# Patient Record
Sex: Female | Born: 1950 | Hispanic: Yes | Marital: Single | Smoking: Never smoker
Health system: Southern US, Community
[De-identification: ages and names within clinical notes are randomized; demographics above are authoritative.]

## PROBLEM LIST (undated history)

## (undated) DIAGNOSIS — E119 Type 2 diabetes mellitus without complications: Secondary | ICD-10-CM

## (undated) DIAGNOSIS — E785 Hyperlipidemia, unspecified: Secondary | ICD-10-CM

## (undated) DIAGNOSIS — F32A Depression, unspecified: Secondary | ICD-10-CM

## (undated) DIAGNOSIS — T8859XA Other complications of anesthesia, initial encounter: Secondary | ICD-10-CM

## (undated) DIAGNOSIS — T4145XA Adverse effect of unspecified anesthetic, initial encounter: Secondary | ICD-10-CM

## (undated) DIAGNOSIS — I10 Essential (primary) hypertension: Secondary | ICD-10-CM

## (undated) DIAGNOSIS — K219 Gastro-esophageal reflux disease without esophagitis: Secondary | ICD-10-CM

## (undated) HISTORY — PX: TUBAL LIGATION: SHX77

---
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
## 2004-05-17 ENCOUNTER — Emergency Department: Payer: Self-pay | Admitting: Emergency Medicine

## 2004-12-17 ENCOUNTER — Ambulatory Visit: Payer: Self-pay | Admitting: Unknown Physician Specialty

## 2005-01-22 ENCOUNTER — Ambulatory Visit: Payer: Self-pay | Admitting: Unknown Physician Specialty

## 2006-04-07 ENCOUNTER — Ambulatory Visit: Payer: Self-pay

## 2006-09-09 ENCOUNTER — Emergency Department: Payer: Self-pay | Admitting: General Practice

## 2006-09-09 ENCOUNTER — Other Ambulatory Visit: Payer: Self-pay

## 2007-08-31 ENCOUNTER — Ambulatory Visit: Payer: Self-pay

## 2009-09-06 ENCOUNTER — Emergency Department: Payer: Self-pay | Admitting: Emergency Medicine

## 2011-08-04 ENCOUNTER — Ambulatory Visit: Payer: Self-pay | Admitting: Family Medicine

## 2011-08-11 ENCOUNTER — Emergency Department: Payer: Self-pay | Admitting: Emergency Medicine

## 2011-08-11 LAB — URINALYSIS, COMPLETE
Blood: NEGATIVE
Hyaline Cast: 1
Nitrite: NEGATIVE
Protein: NEGATIVE
RBC,UR: 1 /HPF (ref 0–5)

## 2011-08-11 LAB — CBC
HGB: 14.5 g/dL (ref 12.0–16.0)
MCH: 29.6 pg (ref 26.0–34.0)
MCV: 88 fL (ref 80–100)
Platelet: 235 10*3/uL (ref 150–440)
WBC: 7.8 10*3/uL (ref 3.6–11.0)

## 2011-08-11 LAB — COMPREHENSIVE METABOLIC PANEL
Alkaline Phosphatase: 88 U/L (ref 50–136)
Anion Gap: 10 (ref 7–16)
BUN: 23 mg/dL — ABNORMAL HIGH (ref 7–18)
Bilirubin,Total: 0.5 mg/dL (ref 0.2–1.0)
Calcium, Total: 8.7 mg/dL (ref 8.5–10.1)
Chloride: 98 mmol/L (ref 98–107)
Creatinine: 0.71 mg/dL (ref 0.60–1.30)
EGFR (African American): >60
Glucose: 224 mg/dL — ABNORMAL HIGH (ref 65–99)
Sodium: 138 mmol/L (ref 136–145)
Total Protein: 7.7 g/dL (ref 6.4–8.2)

## 2011-08-20 ENCOUNTER — Ambulatory Visit: Payer: Self-pay | Admitting: Internal Medicine

## 2014-03-12 ENCOUNTER — Emergency Department: Payer: Self-pay | Admitting: Emergency Medicine

## 2014-08-28 ENCOUNTER — Emergency Department: Admit: 2014-08-28 | Disposition: A | Discharge status: Home / Self Care | Payer: Self-pay | Admitting: Student

## 2016-03-14 ENCOUNTER — Emergency Department
Admission: EM | Admit: 2016-03-14 | Discharge: 2016-03-14 | Disposition: A | Discharge status: Home / Self Care | Payer: Self-pay | Attending: Emergency Medicine | Admitting: Emergency Medicine

## 2016-03-14 ENCOUNTER — Encounter: Payer: Self-pay | Admitting: Emergency Medicine

## 2016-03-14 DIAGNOSIS — E1165 Type 2 diabetes mellitus with hyperglycemia: Secondary | ICD-10-CM | POA: Insufficient documentation

## 2016-03-14 DIAGNOSIS — K5900 Constipation, unspecified: Secondary | ICD-10-CM | POA: Insufficient documentation

## 2016-03-14 DIAGNOSIS — R739 Hyperglycemia, unspecified: Secondary | ICD-10-CM

## 2016-03-14 HISTORY — DX: Hyperlipidemia, unspecified: E78.5

## 2016-03-14 HISTORY — DX: Gastro-esophageal reflux disease without esophagitis: K21.9

## 2016-03-14 HISTORY — DX: Type 2 diabetes mellitus without complications: E11.9

## 2016-03-14 LAB — CBC
HCT: 40.3 % (ref 35.0–47.0)
Hemoglobin: 13.3 g/dL (ref 12.0–16.0)
MCH: 27.9 pg (ref 26.0–34.0)
MCHC: 33 g/dL (ref 32.0–36.0)
MCV: 84.6 fL (ref 80.0–100.0)
PLATELETS: 241 10*3/uL (ref 150–440)
RBC: 4.76 MIL/uL (ref 3.80–5.20)
RDW: 14.8 % — AB (ref 11.5–14.5)
WBC: 4.9 10*3/uL (ref 3.6–11.0)

## 2016-03-14 LAB — COMPREHENSIVE METABOLIC PANEL
ALK PHOS: 90 U/L (ref 38–126)
ALT: 18 U/L (ref 14–54)
AST: 22 U/L (ref 15–41)
Albumin: 3.7 g/dL (ref 3.5–5.0)
Anion gap: 8 (ref 5–15)
BUN: 17 mg/dL (ref 6–20)
CALCIUM: 8.7 mg/dL — AB (ref 8.9–10.3)
CHLORIDE: 97 mmol/L — AB (ref 101–111)
CO2: 30 mmol/L (ref 22–32)
CREATININE: 0.76 mg/dL (ref 0.44–1.00)
GFR calc Af Amer: >60 mL/min (ref >60)
Glucose, Bld: 452 mg/dL — ABNORMAL HIGH (ref 65–99)
Potassium: 4.2 mmol/L (ref 3.5–5.1)
Sodium: 135 mmol/L (ref 135–145)
Total Bilirubin: 0.3 mg/dL (ref 0.3–1.2)
Total Protein: 7.9 g/dL (ref 6.5–8.1)

## 2016-03-14 LAB — GLUCOSE, CAPILLARY: Glucose-Capillary: 456 mg/dL — ABNORMAL HIGH (ref 65–99)

## 2016-03-14 LAB — LIPASE, BLOOD: LIPASE: 24 U/L (ref 11–51)

## 2016-03-14 MED ORDER — METFORMIN HCL 500 MG PO TABS
500.0000 mg | ORAL_TABLET | Freq: Once | ORAL | Status: AC
  Administered 2016-03-14: 500 mg via ORAL
  Filled 2016-03-14: qty 1

## 2016-03-14 MED ORDER — LACTULOSE 10 GM/15ML PO SOLN
10.0000 g | Freq: Once | ORAL | Status: AC
  Administered 2016-03-14: 10 g via ORAL
  Filled 2016-03-14: qty 30

## 2016-03-14 MED ORDER — DOCUSATE SODIUM 100 MG PO CAPS: 100.0000 mg | ORAL_CAPSULE | Freq: Every day | ORAL | 0 refills | Status: AC | PRN

## 2016-03-14 MED ORDER — DOCUSATE SODIUM 100 MG PO CAPS
100.0000 mg | ORAL_CAPSULE | Freq: Once | ORAL | Status: AC
  Administered 2016-03-14: 100 mg via ORAL
  Filled 2016-03-14: qty 1

## 2016-03-14 MED ORDER — SODIUM CHLORIDE 0.9 % IV BOLUS (SEPSIS)
1000.0000 mL | Freq: Once | INTRAVENOUS | Status: AC
  Administered 2016-03-14: 1000 mL via INTRAVENOUS

## 2016-03-14 MED ORDER — MAGNESIUM CITRATE PO SOLN
0.5000 | Freq: Once | ORAL | Status: AC
  Administered 2016-03-14: 0.5 via ORAL
  Filled 2016-03-14: qty 296

## 2016-03-14 NOTE — ED Provider Notes (Signed)
[facility] Emergency Department Provider Note  ____________________________________________   First MD Initiated Contact with Patient 03/14/16 1611     (approximate)  I have reviewed the triage vital signs and the nursing notes.   HISTORY  Chief Complaint Abdominal Pain and Hyperglycemia Patient is Spanish-speaking. Rafael, the Spanish interpreter, was present for the interaction.  HPI Tara Salinas is a 65 y.o. female with a history of diabetes as well as hyperlipidemia who is presenting to the emergency department today with lower abdominal cramping over the past 4 days as well as constipation. She says that she has been pushing to move her bowels and feels like there is something stuck.  Denies any nausea or vomiting. Says that there have been times when she would go about 3 days without moving her bowels with this is longer.  Pain is only mild into the lower abdomen. Says is having difficulty urinating because of what she thinks is the pressure from her not being able to move her bowels.  Also says that she is not taking her diabetes medications but will be able to get up this Monday. She says that she had run out of her prescription.  Past Medical History:  Diagnosis Date  . Diabetes mellitus without complication (HCC)   . GERD (gastroesophageal reflux disease)   . Hyperlipemia     There are no active problems to display for this patient.   Past Surgical History:  Procedure Laterality Date  . TUBAL LIGATION      Prior to Admission medications   Not on File    Allergies Review of patient's allergies indicates no known allergies.  No family history on file.  Social History Social History  Substance Use Topics  . Smoking status: Never Smoker  . Smokeless tobacco: Not on file  . Alcohol use No    Review of Systems Constitutional: No fever/chills Eyes: No visual changes. ENT: No sore throat. Cardiovascular: Denies chest  pain. Respiratory: Denies shortness of breath. Gastrointestinal: No nausea, no vomiting.  No diarrhea.   Genitourinary: Negative for dysuria. Musculoskeletal: Negative for back pain. Skin: Negative for rash. Neurological: Negative for headaches, focal weakness or numbness.  10-point ROS otherwise negative.  ____________________________________________   PHYSICAL EXAM:  VITAL SIGNS: ED Triage Vitals  Enc Vitals Group     BP 03/14/16 1530 (!) 148/60     Pulse Rate 03/14/16 1530 91     Resp 03/14/16 1530 16     Temp 03/14/16 1530 97.7 F (36.5 C)     Temp Source 03/14/16 1530 Oral     SpO2 03/14/16 1530 97 %     Weight 03/14/16 1536 204 lb (92.5 kg)     Height 03/14/16 1536 5\' 4"  (1.626 m)     Head Circumference --      Peak Flow --      Pain Score 03/14/16 1537 10     Pain Loc --      Pain Edu? --      Excl. in GC? --     Constitutional: Alert and oriented. Well appearing and in no acute distress. Eyes: Conjunctivae are normal. PERRL. EOMI. Head: Atraumatic. Nose: No congestion/rhinnorhea. Mouth/Throat: Mucous membranes are moist.   Neck: No stridor.   Cardiovascular: Normal rate, regular rhythm. Grossly normal heart sounds.   Respiratory: Normal respiratory effort.  No retractions. Lungs CTAB. Gastrointestinal: Soft and nontender. No distention. Digital rectal exam without any fecal impaction. Grossly brown stool. Musculoskeletal: No lower extremity  tenderness nor edema.  No joint effusions. Neurologic:  Normal speech and language. No gross focal neurologic deficits are appreciated.  Skin:  Skin is warm, dry and intact. No rash noted. Psychiatric: Mood and affect are normal. Speech and behavior are normal.  ____________________________________________   LABS (all labs ordered are listed, but only abnormal results are displayed)  Labs Reviewed  COMPREHENSIVE METABOLIC PANEL - Abnormal; Notable for the following:       Result Value   Chloride 97 (*)    Glucose,  Bld 452 (*)    Calcium 8.7 (*)    All other components within normal limits  CBC - Abnormal; Notable for the following:    RDW 14.8 (*)    All other components within normal limits  GLUCOSE, CAPILLARY - Abnormal; Notable for the following:    Glucose-Capillary 456 (*)    All other components within normal limits  LIPASE, BLOOD   ____________________________________________  EKG   ____________________________________________  RADIOLOGY   ____________________________________________   PROCEDURES  Procedure(s) performed:   Procedures  Critical Care performed:   ____________________________________________   INITIAL IMPRESSION / ASSESSMENT AND PLAN / ED COURSE  Pertinent labs & imaging results that were available during my care of the patient were reviewed by me and considered in my medical decision making (see chart for details).   ----------------------------------------- 8:28 PM on 03/14/2016 -----------------------------------------  Patient had an enema and then had a bowel movement and now saying that she feels much better and would like to go home. I reexamined her abdomen is soft and nontender. She'll be discharged to home with a prescription for Colace. We will also give her dose of metformin for today as well as tomorrow. She says that she'll be picking up her regular prescriptions coming Monday. No signs of DKA.  Clinical Course     ____________________________________________   FINAL CLINICAL IMPRESSION(S) / ED DIAGNOSES  Hyperglycemia. Constipation.    NEW MEDICATIONS STARTED DURING THIS VISIT:  New Prescriptions   No medications on file     Note:  This document was prepared using Dragon voice recognition software and may include unintentional dictation errors.    Myrna Blazeravid Matthew Esa Raden, MD 03/14/16 2031

## 2016-03-14 NOTE — ED Triage Notes (Signed)
Pt reports lower abd pressure worse when her bladder is full since Thursday. C/o frequency and trouble urinating.  Denies fever Pt also c/o constipation. Last BM, last week has tried miralax with no relief.

## 2016-03-14 NOTE — ED Notes (Signed)
CBG 456 in triage

## 2016-03-14 NOTE — ED Notes (Signed)
Pt reports blood sugars have been elevated but out of her blood sugar medicine. Reports called in refill but cannot get until Monday because pharmacy closed.

## 2016-05-08 ENCOUNTER — Other Ambulatory Visit: Payer: Self-pay | Admitting: Family Medicine

## 2016-05-08 DIAGNOSIS — Z0000 Encounter for general adult medical examination without abnormal findings: Secondary | ICD-10-CM

## 2016-07-16 ENCOUNTER — Ambulatory Visit: Payer: Self-pay | Attending: Family Medicine

## 2016-09-12 ENCOUNTER — Emergency Department
Admission: EM | Admit: 2016-09-12 | Discharge: 2016-09-12 | Disposition: A | Discharge status: Home / Self Care | Payer: Self-pay | Attending: Emergency Medicine | Admitting: Emergency Medicine

## 2016-09-12 ENCOUNTER — Encounter: Payer: Self-pay | Admitting: Emergency Medicine

## 2016-09-12 ENCOUNTER — Emergency Department: Payer: Self-pay

## 2016-09-12 DIAGNOSIS — Z7984 Long term (current) use of oral hypoglycemic drugs: Secondary | ICD-10-CM | POA: Insufficient documentation

## 2016-09-12 DIAGNOSIS — K1120 Sialoadenitis, unspecified: Secondary | ICD-10-CM | POA: Insufficient documentation

## 2016-09-12 DIAGNOSIS — E119 Type 2 diabetes mellitus without complications: Secondary | ICD-10-CM | POA: Insufficient documentation

## 2016-09-12 DIAGNOSIS — R220 Localized swelling, mass and lump, head: Secondary | ICD-10-CM

## 2016-09-12 LAB — CBC WITH DIFFERENTIAL/PLATELET
BASOS ABS: 0.1 10*3/uL (ref 0–0.1)
BASOS PCT: 1 %
EOS ABS: 0 10*3/uL (ref 0–0.7)
EOS PCT: 0 %
HCT: 40.3 % (ref 35.0–47.0)
Hemoglobin: 13.5 g/dL (ref 12.0–16.0)
Lymphocytes Relative: 13 %
Lymphs Abs: 1.3 10*3/uL (ref 1.0–3.6)
MCH: 28.1 pg (ref 26.0–34.0)
MCHC: 33.5 g/dL (ref 32.0–36.0)
MCV: 84 fL (ref 80.0–100.0)
MONO ABS: 0.8 10*3/uL (ref 0.2–0.9)
Monocytes Relative: 8 %
Neutro Abs: 7.9 10*3/uL — ABNORMAL HIGH (ref 1.4–6.5)
Neutrophils Relative %: 78 %
Platelets: 296 10*3/uL (ref 150–440)
RBC: 4.8 MIL/uL (ref 3.80–5.20)
RDW: 14.5 % (ref 11.5–14.5)
WBC: 10.1 10*3/uL (ref 3.6–11.0)

## 2016-09-12 LAB — BASIC METABOLIC PANEL
ANION GAP: 11 (ref 5–15)
BUN: 13 mg/dL (ref 6–20)
CALCIUM: 9.2 mg/dL (ref 8.9–10.3)
CO2: 31 mmol/L (ref 22–32)
CREATININE: 0.44 mg/dL (ref 0.44–1.00)
Chloride: 92 mmol/L — ABNORMAL LOW (ref 101–111)
GFR calc Af Amer: >60 mL/min (ref >60)
GLUCOSE: 160 mg/dL — AB (ref 65–99)
Potassium: 3.7 mmol/L (ref 3.5–5.1)
Sodium: 134 mmol/L — ABNORMAL LOW (ref 135–145)

## 2016-09-12 MED ORDER — SODIUM CHLORIDE 0.9 % IV BOLUS (SEPSIS)
1000.0000 mL | Freq: Once | INTRAVENOUS | Status: AC
  Administered 2016-09-12: 1000 mL via INTRAVENOUS

## 2016-09-12 MED ORDER — METHYLPREDNISOLONE 8 MG PO TABS: ORAL_TABLET | ORAL | 0 refills | Status: DC

## 2016-09-12 MED ORDER — KETOROLAC TROMETHAMINE 30 MG/ML IJ SOLN
30.0000 mg | Freq: Once | INTRAMUSCULAR | Status: AC
  Administered 2016-09-12: 30 mg via INTRAVENOUS
  Filled 2016-09-12: qty 1

## 2016-09-12 MED ORDER — AMOXICILLIN-POT CLAVULANATE 875-125 MG PO TABS: 1.0000 | ORAL_TABLET | Freq: Two times a day (BID) | ORAL | 0 refills | Status: AC

## 2016-09-12 MED ORDER — IOPAMIDOL (ISOVUE-300) INJECTION 61%
75.0000 mL | Freq: Once | INTRAVENOUS | Status: AC | PRN
  Administered 2016-09-12: 75 mL via INTRAVENOUS

## 2016-09-12 NOTE — Discharge Instructions (Signed)
Please seek medical attention for any high fevers, chest pain, shortness of breath, difficulty with breathing, change in behavior, persistent vomiting, bloody stool or any other new or concerning symptoms.

## 2016-09-12 NOTE — ED Provider Notes (Signed)
[facility] Emergency Department Provider Note ____________________________________________   I have reviewed the triage vital signs and the triage nursing note.  HISTORY  Chief Complaint Facial Swelling   Historian Patient - through Spanish interpreter  HPI Tara Salinas is a 66 y.o. female with a history of diabetes, presents with swelling under the left jaw that started yesterday. It is worse today. Moderate to severe swelling and pain. No skin rash or redness. States it hurts now feels a little swollen on the inside of her cheek. Mild increased trouble swallowing. No trouble breathing. She is missing teeth, but there are no additional broken teeth at the lower back jaw.  Denies fevers. Denies nausea or vomiting.  Nothing seems to worsen better. No similar symptoms previously.    Past Medical History:  Diagnosis Date  . Diabetes mellitus without complication (HCC)   . GERD (gastroesophageal reflux disease)   . Hyperlipemia     There are no active problems to display for this patient.   Past Surgical History:  Procedure Laterality Date  . TUBAL LIGATION      Prior to Admission medications   Medication Sig Start Date End Date Taking? Authorizing Provider  atorvastatin (LIPITOR) 40 MG tablet Take 40 mg by mouth daily.   Yes [provider]  hydrOXYzine (ATARAX/VISTARIL) 10 MG tablet Take 10 mg by mouth at bedtime as needed (to help patient fall asleep).   Yes [provider]  metFORMIN (GLUCOPHAGE) 1000 MG tablet Take 1,000 mg by mouth 2 (two) times daily with a meal.   Yes [provider]  pantoprazole (PROTONIX) 40 MG tablet Take 40 mg by mouth daily.   Yes [provider]  sertraline (ZOLOFT) 50 MG tablet Take 50 mg by mouth daily.   Yes [provider]  docusate sodium (COLACE) 100 MG capsule Take 1 capsule (100 mg total) by mouth daily as needed. 03/14/16 03/14/17  Schaevitz, Myra Rude, MD     No Known Allergies  No family history on file.  Social History Social History  Substance Use Topics  . Smoking status: Never Smoker  . Smokeless tobacco: Never Used  . Alcohol use No    Review of Systems  Constitutional: Negative for fever. Eyes: Negative for visual changes. ENT: Negative for sore throat.  Left jaw swelling as per history of present illness. Cardiovascular: Negative for chest pain. Respiratory: Negative for shortness of breath. Gastrointestinal: Negative for abdominal pain, vomiting and diarrhea. Genitourinary: Negative for dysuria. Musculoskeletal: Negative for back pain. Skin: Negative for rash. Neurological: Negative for headache. 10 point Review of Systems otherwise negative ____________________________________________   PHYSICAL EXAM:  VITAL SIGNS: ED Triage Vitals  Enc Vitals Group     BP 09/12/16 1147 (!) 148/67     Pulse Rate 09/12/16 1147 (!) 109     Resp 09/12/16 1147 18     Temp 09/12/16 1147 99.4 F (37.4 C)     Temp Source 09/12/16 1147 Oral     SpO2 09/12/16 1147 98 %     Weight 09/12/16 1148 200 lb (90.7 kg)     Height 09/12/16 1148 5\' 4"  (1.626 m)     Head Circumference --      Peak Flow --      Pain Score 09/12/16 1157 10     Pain Loc --      Pain Edu? --      Excl. in GC? --      Constitutional: Alert and oriented. Well  appearing and in no distress. HEENT   Head: Normocephalic.      Eyes: Conjunctivae are normal. PERRL. Normal extraocular movements.      Ears:         Nose: No congestion/rhinnorhea.   Mouth/Throat: Mucous membranes are moist.  Mild swelling at what appears to be exit site for salivary gland left lateral cheek. Moderate to severe swelling under the left jaw which is tender and firm. There is no redness. There is no pointing or obvious abscess.   Neck: No stridor. Cardiovascular/Chest: Normal rate, regular rhythm.  No murmurs, rubs, or gallops. Respiratory: Normal respiratory effort without  tachypnea nor retractions. Breath sounds are clear and equal bilaterally. No wheezes/rales/rhonchi. Gastrointestinal: Soft. No distention, no guarding, no rebound. Nontender.  Genitourinary/rectal:Deferred Musculoskeletal: Normal appearance of the extremities. Neurologic: No facial droop. Normal speech and language. No gross or focal neurologic deficits are appreciated. Skin:  Skin is warm, dry and intact. No rash noted. Psychiatric: Mood and affect are normal. Speech and behavior are normal. Patient exhibits appropriate insight and judgment.   ____________________________________________  LABS (pertinent positives/negatives)  Labs Reviewed  BASIC METABOLIC PANEL - Abnormal; Notable for the following:       Result Value   Sodium 134 (*)    Chloride 92 (*)    Glucose, Bld 160 (*)    All other components within normal limits  CBC WITH DIFFERENTIAL/PLATELET - Abnormal; Notable for the following:    Neutro Abs 7.9 (*)    All other components within normal limits    ____________________________________________    EKG I, Governor Rooksebecca Shaylen Nephew, MD, the attending physician have personally viewed and interpreted all ECGs.  None ____________________________________________  RADIOLOGY All Xrays were viewed by me. Imaging interpreted by Radiologist.  CT neck with contrast soft tissue:  Pending __________________________________________  PROCEDURES  Procedure(s) performed: None  Critical Care performed: None  ____________________________________________   ED COURSE / ASSESSMENT AND PLAN  Pertinent labs & imaging results that were available during my care of the patient were reviewed by me and considered in my medical decision making (see chart for details).   Left lower jaw swelling, suspect either infection versus swelling related to obstructed salivary gland.  Overall well-appearing. She does have a low-grade temperature and on triage pulse 109. No hypertension. She does not  report a fever at home.  After labs will CT the neck for further evaluation. Starting with IV fluids as well as Toradol for pain.  Patient care to be transferred to Dr. Derrill KayGoodman at shift change 4 PM. Disposition pending CT of the neck.    CONSULTATIONS:   None   Patient / Family / Caregiver informed of clinical course, medical decision-making process, and agree with plan.    ___________________________________________   FINAL CLINICAL IMPRESSION(S) / ED DIAGNOSES   Final diagnoses:  Jaw swelling              Note: This dictation was prepared with Dragon dictation. Any transcriptional errors that result from this process are unintentional    Governor RooksLord, Forrestine Lecrone, MD 09/12/16 1539

## 2016-09-12 NOTE — ED Notes (Signed)

## 2016-09-12 NOTE — ED Provider Notes (Signed)
-----------------------------------------   5:15 PM on 09/12/2016 -----------------------------------------  CT soft tissue neck IMPRESSION: 1. Left submandibular sialadenitis due to 7 x 4 mm proximal duct calculus. 2. Reactive edema in the supraglottic larynx thickens the epiglottis and left aryepiglottic fold and causes a degree of airway narrowing. 3. 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  Patient's CT returned showing a 7 x 4 mm stone in submandibular ducts. Discussed with Dr. Jenne CampusMcQueen who recommended and Robaxin steroids. The CT scan also is calling a degree of airway narrowing however patient is in no respiratory distress. Additionally if there is some swelling around her airway the steroids should help. Discussed return precautions with patient including airway issues. This discussion was done with a hospital interpreter   Phineas SemenGoodman, Ramya Vanbergen, MD 09/12/16 1729

## 2016-09-12 NOTE — ED Triage Notes (Addendum)
Through spanish interpreter:  Patient presents to the ED with swollen area to patient's left jaw.  Patient states area is tender on palpation and she first noticed area yesterday.  Patient is in no obvious distress at this time.  Patient is maintaining secretions but having slight difficulty speaking.  Patient reports difficulty swallowing, and states she has not been able to eat since yesterday.  Patient denies any dental issues or dental pain.  Patient reports some difficulty swallowing.

## 2018-01-11 ENCOUNTER — Other Ambulatory Visit: Payer: Self-pay | Admitting: Family Medicine

## 2018-01-11 DIAGNOSIS — Z1231 Encounter for screening mammogram for malignant neoplasm of breast: Secondary | ICD-10-CM

## 2018-02-13 ENCOUNTER — Emergency Department
Admission: EM | Admit: 2018-02-13 | Discharge: 2018-02-13 | Disposition: A | Discharge status: Home / Self Care | Payer: Self-pay | Attending: Emergency Medicine | Admitting: Emergency Medicine

## 2018-02-13 ENCOUNTER — Emergency Department: Payer: Self-pay

## 2018-02-13 ENCOUNTER — Other Ambulatory Visit: Payer: Self-pay

## 2018-02-13 DIAGNOSIS — Z79899 Other long term (current) drug therapy: Secondary | ICD-10-CM | POA: Insufficient documentation

## 2018-02-13 DIAGNOSIS — E119 Type 2 diabetes mellitus without complications: Secondary | ICD-10-CM | POA: Insufficient documentation

## 2018-02-13 DIAGNOSIS — S42251A Displaced fracture of greater tuberosity of right humerus, initial encounter for closed fracture: Secondary | ICD-10-CM | POA: Insufficient documentation

## 2018-02-13 DIAGNOSIS — W0110XA Fall on same level from slipping, tripping and stumbling with subsequent striking against unspecified object, initial encounter: Secondary | ICD-10-CM | POA: Insufficient documentation

## 2018-02-13 DIAGNOSIS — Z7984 Long term (current) use of oral hypoglycemic drugs: Secondary | ICD-10-CM | POA: Insufficient documentation

## 2018-02-13 DIAGNOSIS — Y929 Unspecified place or not applicable: Secondary | ICD-10-CM | POA: Insufficient documentation

## 2018-02-13 DIAGNOSIS — Y998 Other external cause status: Secondary | ICD-10-CM | POA: Insufficient documentation

## 2018-02-13 DIAGNOSIS — Y9389 Activity, other specified: Secondary | ICD-10-CM | POA: Insufficient documentation

## 2018-02-13 MED ORDER — HYDROCODONE-ACETAMINOPHEN 5-325 MG PO TABS
1.0000 | ORAL_TABLET | ORAL | Status: AC
  Administered 2018-02-13: 1 via ORAL
  Filled 2018-02-13: qty 1

## 2018-02-13 MED ORDER — HYDROCODONE-ACETAMINOPHEN 5-325 MG PO TABS: 1.0000 | ORAL_TABLET | ORAL | 0 refills | Status: DC | PRN

## 2018-02-13 NOTE — ED Triage Notes (Signed)
Pt c/o tripping and falling today. Right shoulder pain. Denies LOC.

## 2018-02-13 NOTE — ED Notes (Signed)
r shoulder immobilizer applied

## 2018-02-13 NOTE — ED Notes (Signed)
Pt reports yesterday-  She fell -  Stepped on an object and fell on carpet   PT inj r  Shoulder  rom limited  She did not hit her head

## 2018-02-13 NOTE — Discharge Instructions (Addendum)
Please call orthopedic office Monday morning to schedule follow-up appointment.  Continue with shoulder immobilizer.  You may remove mobilize to shower.  Avoid shoulder range of motion when out of the immobilizer.  Take Norco as needed for pain.  Return to the emergency department for any increasing pain, swelling, worsening symptoms or urgent changes in health.

## 2018-02-13 NOTE — ED Provider Notes (Signed)
[facility] EMERGENCY DEPARTMENT Provider Note   CSN: [id number] Arrival date & time: 02/13/18  1201     History   Chief Complaint Chief Complaint  Patient presents with  . Fall    HPI Tara Salinas is a 67 y.o. female presents to the emergency department for evaluation of right shoulder pain.  Patient stumbled and fell 1 day ago landing on her right shoulder.  She denies any other injury throughout her body.  No headache, neck pain.  No numbness tingling radicular symptoms.  Patient points her pain along the proximal humerus.  She denies any elbow or wrist pain.  She has not any medications for pain.  Pain is moderate.  No chest pain or shortness of breath.  HPI  Past Medical History:  Diagnosis Date  . Diabetes mellitus without complication (HCC)   . GERD (gastroesophageal reflux disease)   . Hyperlipemia     There are no active problems to display for this patient.   Past Surgical History:  Procedure Laterality Date  . TUBAL LIGATION       OB History   None      Home Medications    Prior to Admission medications   Medication Sig Start Date End Date Taking? Authorizing Provider  atorvastatin (LIPITOR) 40 MG tablet Take 40 mg by mouth daily.    [provider]  HYDROcodone-acetaminophen (NORCO) 5-325 MG tablet Take 1 tablet by mouth every 4 (four) hours as needed for moderate pain. 02/13/18   Evon Slack, PA-C  hydrOXYzine (ATARAX/VISTARIL) 10 MG tablet Take 10 mg by mouth at bedtime as needed (to help patient fall asleep).    [provider]  metFORMIN (GLUCOPHAGE) 1000 MG tablet Take 1,000 mg by mouth 2 (two) times daily with a meal.    [provider]  methylPREDNISolone (MEDROL) 8 MG tablet Take 6 tablets orally on Day 1 Take 5 tablets orally on Day 2 Take 4 tablets orally on Day 3 Take 3 tablets orally on Day 4 Take 2 tablets orally on Day 5 Take 1 tablets orally on Day 6 09/12/16   Phineas Semen, MD  pantoprazole (PROTONIX) 40 MG tablet Take 40 mg by mouth daily.    [provider]  sertraline (ZOLOFT) 50 MG tablet Take 50 mg by mouth daily.    [provider]    Family History No family history on file.  Social History Social History   Tobacco Use  . Smoking status: Never Smoker  . Smokeless tobacco: Never Used  Substance Use Topics  . Alcohol use: No  . Drug use: No     Allergies   Patient has no known allergies.   Review of Systems Review of Systems  Constitutional: Negative for activity change.  Eyes: Negative for pain and visual disturbance.  Respiratory: Negative for shortness of breath.   Cardiovascular: Negative for chest pain and leg swelling.  Gastrointestinal: Negative for abdominal pain.  Genitourinary: Negative for flank pain and pelvic pain.  Musculoskeletal: Positive for arthralgias. Negative for gait problem, joint swelling, myalgias, neck pain and neck stiffness.  Skin: Negative for wound.  Neurological: Negative for dizziness, syncope, weakness, light-headedness, numbness and headaches.  Psychiatric/Behavioral: Negative for confusion and decreased concentration.     Physical Exam Updated Vital Signs BP (!) 139/54   Pulse 90   Temp 98 F (36.7 C)   Resp 18   Ht 5' (1.524 m)   Wt 81.6 kg   SpO2 97%  BMI 35.15 kg/m   Physical Exam  Constitutional: She is oriented to person, place, and time. She appears well-developed and well-nourished.  HENT:  Head: Normocephalic and atraumatic.  Right Ear: External ear normal.  Left Ear: External ear normal.  Nose: Nose normal.  Eyes: Pupils are equal, round, and reactive to light. Conjunctivae and EOM are normal.  Neck: Normal range of motion.  Cardiovascular: Normal rate.  Pulmonary/Chest: Effort normal and breath sounds normal. No respiratory distress.  Abdominal: Soft. There is no tenderness.  Musculoskeletal: She exhibits no edema or deformity.  Positive  tenderness on the right proximal humerus with no deformity.  Neurovascular intact in right upper extremity.  Limited active range of motion of the right shoulder.  Neurological: She is alert and oriented to person, place, and time. No cranial nerve deficit. Coordination normal.  Skin: Skin is warm and dry. No rash noted.  Psychiatric: She has a normal mood and affect. Her behavior is normal.     ED Treatments / Results  Labs (all labs ordered are listed, but only abnormal results are displayed) Labs Reviewed - No data to display  EKG None  Radiology Dg Shoulder Right  Result Date: 02/13/2018 CLINICAL DATA:  67 year old female status post fall yesterday with subsequent right shoulder pain. EXAM: RIGHT SHOULDER - 2+ VIEW COMPARISON:  None. FINDINGS: Mildly comminuted fracture at the greater tuberosity of the proximal right humerus. Mild proximal retraction of the butterfly fragment. The articular surface and remaining right humeral head appear intact. No glenohumeral joint dislocation. The left humeral metadiaphysis and visible shaft otherwise appear intact. Visible right clavicle in scapula appear intact. Negative visible right ribs and lung parenchyma. IMPRESSION: 1. Mildly comminuted fracture of the proximal right humerus limited to the greater tuberosity. Mild proximal retraction of the tuberosity fragment. 2. No superimposed glenohumeral joint dislocation or other acute osseous abnormality identified. Electronically Signed   By: Odessa Fleming M.D.   On: 02/13/2018 14:02    Procedures Procedures (including critical care time)  Medications Ordered in ED Medications  HYDROcodone-acetaminophen (NORCO/VICODIN) 5-325 MG per tablet 1 tablet (1 tablet Oral Given 02/13/18 1312)     Initial Impression / Assessment and Plan / ED Course  I have reviewed the triage vital signs and the nursing notes.  Pertinent labs & imaging results that were available during my care of the patient were reviewed by  me and considered in my medical decision making (see chart for details).     67 year old female with right proximal humerus fracture along the greater tuberosity.  Patient placed into a shoulder immobilizer.  She will call orthopedics tomorrow to schedule follow-up for it.  She is given Norco for pain.  She understands signs symptoms to return to the ED for.  Final Clinical Impressions(s) / ED Diagnoses   Final diagnoses:  Traumatic closed displaced fracture of greater tuberosity of humerus, right, initial encounter    ED Discharge Orders         Ordered    HYDROcodone-acetaminophen (NORCO) 5-325 MG tablet  Every 4 hours PRN     02/13/18 1507           Evon Slack, PA-C 02/13/18 1510    Don Perking, [place], MD 02/22/18 [phone]

## 2018-12-25 ENCOUNTER — Encounter: Payer: Self-pay | Admitting: *Deleted

## 2018-12-25 ENCOUNTER — Other Ambulatory Visit: Payer: Self-pay

## 2018-12-25 DIAGNOSIS — J1289 Other viral pneumonia: Secondary | ICD-10-CM | POA: Diagnosis present

## 2018-12-25 DIAGNOSIS — E871 Hypo-osmolality and hyponatremia: Secondary | ICD-10-CM | POA: Diagnosis present

## 2018-12-25 DIAGNOSIS — R911 Solitary pulmonary nodule: Secondary | ICD-10-CM | POA: Diagnosis present

## 2018-12-25 DIAGNOSIS — E860 Dehydration: Secondary | ICD-10-CM | POA: Diagnosis present

## 2018-12-25 DIAGNOSIS — U071 COVID-19: Secondary | ICD-10-CM | POA: Diagnosis not present

## 2018-12-25 DIAGNOSIS — E785 Hyperlipidemia, unspecified: Secondary | ICD-10-CM | POA: Diagnosis present

## 2018-12-25 DIAGNOSIS — E1165 Type 2 diabetes mellitus with hyperglycemia: Secondary | ICD-10-CM | POA: Diagnosis present

## 2018-12-25 DIAGNOSIS — K529 Noninfective gastroenteritis and colitis, unspecified: Secondary | ICD-10-CM | POA: Diagnosis present

## 2018-12-25 DIAGNOSIS — Z7984 Long term (current) use of oral hypoglycemic drugs: Secondary | ICD-10-CM

## 2018-12-25 DIAGNOSIS — K219 Gastro-esophageal reflux disease without esophagitis: Secondary | ICD-10-CM | POA: Diagnosis present

## 2018-12-25 LAB — CBC
HCT: 34.3 % — ABNORMAL LOW (ref 36.0–46.0)
Hemoglobin: 11.6 g/dL — ABNORMAL LOW (ref 12.0–15.0)
MCH: 27 pg (ref 26.0–34.0)
MCHC: 33.8 g/dL (ref 30.0–36.0)
MCV: 80 fL (ref 80.0–100.0)
Platelets: 306 10*3/uL (ref 150–400)
RBC: 4.29 MIL/uL (ref 3.87–5.11)
RDW: 13.2 % (ref 11.5–15.5)
WBC: 11.6 10*3/uL — ABNORMAL HIGH (ref 4.0–10.5)
nRBC: 0 % (ref 0.0–0.2)

## 2018-12-25 LAB — COMPREHENSIVE METABOLIC PANEL
ALT: 25 U/L (ref 0–44)
AST: 24 U/L (ref 15–41)
Albumin: 3.4 g/dL — ABNORMAL LOW (ref 3.5–5.0)
Alkaline Phosphatase: 94 U/L (ref 38–126)
Anion gap: 19 — ABNORMAL HIGH (ref 5–15)
BUN: 15 mg/dL (ref 8–23)
CO2: 26 mmol/L (ref 22–32)
Calcium: 8.3 mg/dL — ABNORMAL LOW (ref 8.9–10.3)
Chloride: 78 mmol/L — ABNORMAL LOW (ref 98–111)
Creatinine, Ser: 0.57 mg/dL (ref 0.44–1.00)
GFR calc Af Amer: >60 mL/min (ref >60)
GFR calc non Af Amer: >60 mL/min (ref >60)
Glucose, Bld: 347 mg/dL — ABNORMAL HIGH (ref 70–99)
Potassium: 3.4 mmol/L — ABNORMAL LOW (ref 3.5–5.1)
Sodium: 123 mmol/L — ABNORMAL LOW (ref 135–145)
Total Bilirubin: 0.4 mg/dL (ref 0.3–1.2)
Total Protein: 8 g/dL (ref 6.5–8.1)

## 2018-12-25 LAB — LIPASE, BLOOD: Lipase: 27 U/L (ref 11–51)

## 2018-12-25 MED ORDER — ACETAMINOPHEN 325 MG PO TABS
650.0000 mg | ORAL_TABLET | Freq: Once | ORAL | Status: AC
  Administered 2018-12-25: 650 mg via ORAL
  Filled 2018-12-25: qty 2

## 2018-12-25 MED ORDER — ONDANSETRON 4 MG PO TBDP
4.0000 mg | ORAL_TABLET | Freq: Once | ORAL | Status: AC | PRN
  Administered 2018-12-25: 4 mg via ORAL
  Filled 2018-12-25: qty 1

## 2018-12-25 NOTE — ED Triage Notes (Addendum)
Per Marketing executive. Patient c/o left to right abdominal pain for several days. Patient denies constipation, but reports burning with urination. Patient states pain radiates to back. Patient reports poor appetite and several episodes of vomiting. Patient reports having a fever. Denies shortness of breath or cough.

## 2018-12-26 ENCOUNTER — Emergency Department: Payer: HRSA Program

## 2018-12-26 ENCOUNTER — Inpatient Hospital Stay
Admission: EM | Admit: 2018-12-26 | Discharge: 2018-12-26 | DRG: 177 | Disposition: A | Discharge status: Other Acute Inpatient Hospital | Payer: HRSA Program | Attending: Internal Medicine | Admitting: Internal Medicine

## 2018-12-26 ENCOUNTER — Other Ambulatory Visit: Payer: Self-pay

## 2018-12-26 ENCOUNTER — Encounter: Payer: Self-pay | Admitting: Radiology

## 2018-12-26 ENCOUNTER — Observation Stay (HOSPITAL_COMMUNITY)
Admission: RE | Admit: 2018-12-26 | Discharge: 2018-12-28 | Disposition: A | Discharge status: Home / Self Care | Payer: HRSA Program | Source: Other Acute Inpatient Hospital | Attending: Internal Medicine | Admitting: Internal Medicine

## 2018-12-26 DIAGNOSIS — Z7984 Long term (current) use of oral hypoglycemic drugs: Secondary | ICD-10-CM | POA: Diagnosis not present

## 2018-12-26 DIAGNOSIS — U071 COVID-19: Secondary | ICD-10-CM | POA: Diagnosis present

## 2018-12-26 DIAGNOSIS — R109 Unspecified abdominal pain: Secondary | ICD-10-CM | POA: Diagnosis present

## 2018-12-26 DIAGNOSIS — R05 Cough: Secondary | ICD-10-CM | POA: Diagnosis present

## 2018-12-26 DIAGNOSIS — Z79899 Other long term (current) drug therapy: Secondary | ICD-10-CM | POA: Insufficient documentation

## 2018-12-26 DIAGNOSIS — J189 Pneumonia, unspecified organism: Secondary | ICD-10-CM

## 2018-12-26 DIAGNOSIS — E871 Hypo-osmolality and hyponatremia: Secondary | ICD-10-CM

## 2018-12-26 DIAGNOSIS — K219 Gastro-esophageal reflux disease without esophagitis: Secondary | ICD-10-CM | POA: Diagnosis not present

## 2018-12-26 DIAGNOSIS — R739 Hyperglycemia, unspecified: Secondary | ICD-10-CM

## 2018-12-26 DIAGNOSIS — R197 Diarrhea, unspecified: Secondary | ICD-10-CM | POA: Insufficient documentation

## 2018-12-26 DIAGNOSIS — R911 Solitary pulmonary nodule: Secondary | ICD-10-CM | POA: Diagnosis present

## 2018-12-26 DIAGNOSIS — E1165 Type 2 diabetes mellitus with hyperglycemia: Secondary | ICD-10-CM | POA: Diagnosis present

## 2018-12-26 DIAGNOSIS — E785 Hyperlipidemia, unspecified: Secondary | ICD-10-CM | POA: Diagnosis present

## 2018-12-26 DIAGNOSIS — R112 Nausea with vomiting, unspecified: Secondary | ICD-10-CM | POA: Diagnosis present

## 2018-12-26 DIAGNOSIS — E119 Type 2 diabetes mellitus without complications: Secondary | ICD-10-CM | POA: Diagnosis not present

## 2018-12-26 DIAGNOSIS — K529 Noninfective gastroenteritis and colitis, unspecified: Secondary | ICD-10-CM | POA: Diagnosis present

## 2018-12-26 DIAGNOSIS — E860 Dehydration: Secondary | ICD-10-CM | POA: Insufficient documentation

## 2018-12-26 DIAGNOSIS — J1289 Other viral pneumonia: Secondary | ICD-10-CM | POA: Diagnosis present

## 2018-12-26 DIAGNOSIS — R0602 Shortness of breath: Secondary | ICD-10-CM | POA: Diagnosis present

## 2018-12-26 HISTORY — DX: COVID-19: U07.1

## 2018-12-26 LAB — GLUCOSE, CAPILLARY
Glucose-Capillary: 284 mg/dL — ABNORMAL HIGH (ref 70–99)
Glucose-Capillary: 275 mg/dL — ABNORMAL HIGH (ref 70–99)
Glucose-Capillary: 286 mg/dL — ABNORMAL HIGH (ref 70–99)
Glucose-Capillary: 223 mg/dL — ABNORMAL HIGH (ref 70–99)
Glucose-Capillary: 154 mg/dL — ABNORMAL HIGH (ref 70–99)

## 2018-12-26 LAB — BASIC METABOLIC PANEL
Anion gap: 16 — ABNORMAL HIGH (ref 5–15)
Anion gap: 10 (ref 5–15)
BUN: 13 mg/dL (ref 8–23)
BUN: 10 mg/dL (ref 8–23)
CO2: 29 mmol/L (ref 22–32)
CO2: 28 mmol/L (ref 22–32)
Calcium: 7.9 mg/dL — ABNORMAL LOW (ref 8.9–10.3)
Calcium: 8.3 mg/dL — ABNORMAL LOW (ref 8.9–10.3)
Chloride: 80 mmol/L — ABNORMAL LOW (ref 98–111)
Chloride: 90 mmol/L — ABNORMAL LOW (ref 98–111)
Creatinine, Ser: 0.54 mg/dL (ref 0.44–1.00)
Creatinine, Ser: 0.57 mg/dL (ref 0.44–1.00)
GFR calc Af Amer: >60 mL/min (ref >60)
GFR calc Af Amer: >60 mL/min (ref >60)
GFR calc non Af Amer: >60 mL/min (ref >60)
GFR calc non Af Amer: >60 mL/min (ref >60)
Glucose, Bld: 314 mg/dL — ABNORMAL HIGH (ref 70–99)
Glucose, Bld: 253 mg/dL — ABNORMAL HIGH (ref 70–99)
Potassium: 3.4 mmol/L — ABNORMAL LOW (ref 3.5–5.1)
Potassium: 3.2 mmol/L — ABNORMAL LOW (ref 3.5–5.1)
Sodium: 125 mmol/L — ABNORMAL LOW (ref 135–145)
Sodium: 128 mmol/L — ABNORMAL LOW (ref 135–145)

## 2018-12-26 LAB — MAGNESIUM: Magnesium: 1.7 mg/dL (ref 1.7–2.4)

## 2018-12-26 LAB — CBC
HCT: 34.6 % — ABNORMAL LOW (ref 36.0–46.0)
Hemoglobin: 11.5 g/dL — ABNORMAL LOW (ref 12.0–15.0)
MCH: 27 pg (ref 26.0–34.0)
MCHC: 33.2 g/dL (ref 30.0–36.0)
MCV: 81.2 fL (ref 80.0–100.0)
Platelets: 312 10*3/uL (ref 150–400)
RBC: 4.26 MIL/uL (ref 3.87–5.11)
RDW: 13.2 % (ref 11.5–15.5)
WBC: 9.4 10*3/uL (ref 4.0–10.5)
nRBC: 0 % (ref 0.0–0.2)

## 2018-12-26 LAB — HEMOGLOBIN A1C
Hgb A1c MFr Bld: 9 % — ABNORMAL HIGH (ref 4.8–5.6)
Mean Plasma Glucose: 211.6 mg/dL

## 2018-12-26 LAB — URINALYSIS, COMPLETE (UACMP) WITH MICROSCOPIC
Bilirubin Urine: NEGATIVE
Glucose, UA: >=500 mg/dL — AB
Hgb urine dipstick: NEGATIVE
Ketones, ur: NEGATIVE mg/dL
Nitrite: NEGATIVE
Protein, ur: 30 mg/dL — AB
Specific Gravity, Urine: 1.018 (ref 1.005–1.030)
pH: 6 (ref 5.0–8.0)

## 2018-12-26 LAB — OSMOLALITY: Osmolality: 280 mOsm/kg (ref 275–295)

## 2018-12-26 LAB — CREATININE, URINE, RANDOM
Creatinine, Urine: 43 mg/dL
Creatinine, Urine: 55 mg/dL

## 2018-12-26 LAB — OSMOLALITY, URINE: Osmolality, Ur: 361 mOsm/kg (ref 300–900)

## 2018-12-26 LAB — FIBRIN DERIVATIVES D-DIMER (ARMC ONLY): Fibrin derivatives D-dimer (ARMC): 1373.82 ng/mL (FEU) — ABNORMAL HIGH (ref 0.00–499.00)

## 2018-12-26 LAB — TRIGLYCERIDES: Triglycerides: 75 mg/dL (ref <150)

## 2018-12-26 LAB — LACTATE DEHYDROGENASE: LDH: 153 U/L (ref 98–192)

## 2018-12-26 LAB — SEDIMENTATION RATE: Sed Rate: 68 mm/hr — ABNORMAL HIGH (ref 0–30)

## 2018-12-26 LAB — SARS CORONAVIRUS 2 BY RT PCR (HOSPITAL ORDER, PERFORMED IN ~~LOC~~ HOSPITAL LAB): SARS Coronavirus 2: POSITIVE — AB

## 2018-12-26 LAB — ABO/RH: ABO/RH(D): O POS

## 2018-12-26 LAB — SODIUM, URINE, RANDOM: Sodium, Ur: 60 mmol/L

## 2018-12-26 LAB — FERRITIN: Ferritin: 43 ng/mL (ref 11–307)

## 2018-12-26 LAB — C-REACTIVE PROTEIN: CRP: 8 mg/dL — ABNORMAL HIGH (ref <1.0)

## 2018-12-26 IMAGING — CT CT ABDOMEN AND PELVIS WITH CONTRAST
2 of 5 series · 16 of 46 positions shown, 18 images · IV contrast (APPLIED)
Comparison: None.

CLINICAL DATA: Acute abdominal pain.

EXAM:
CT ABDOMEN AND PELVIS WITH CONTRAST
TECHNIQUE: Multidetector CT imaging of the abdomen and pelvis was performed
using the standard protocol following bolus administration of
intravenous contrast.
CONTRAST:  100mL OMNIPAQUE IOHEXOL 300 MG/ML  SOLN

[Series 2: routine abd/pel with · axial · 0.84mm/px · z∈[-899,-509]mm · 13 of 88 slices shown, 15 images]
[im 5/88  soft-tissue]
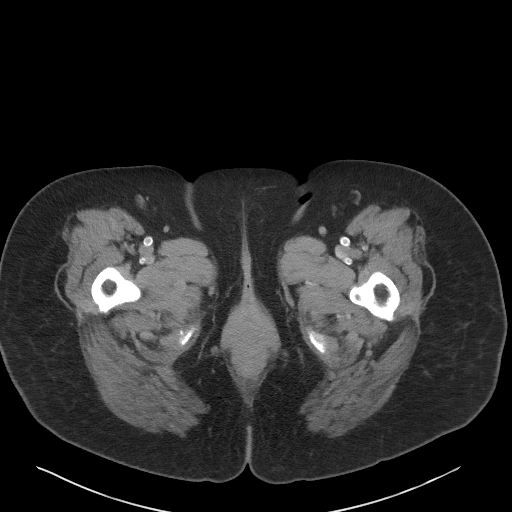
[im 5/88  bone]
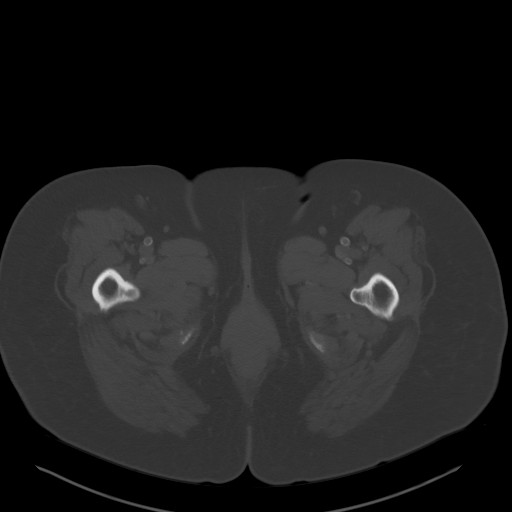
[im 14/88  soft-tissue]
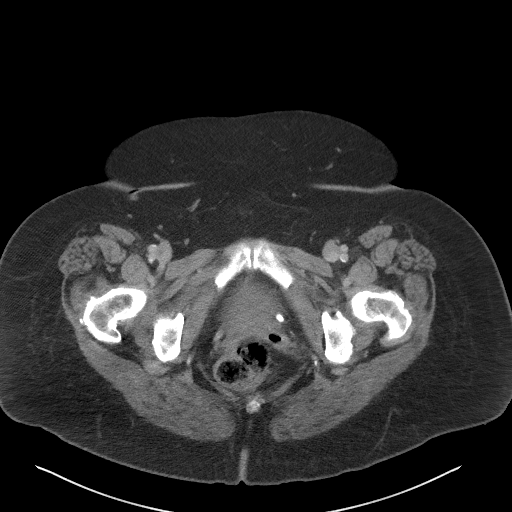
[im 19/88  soft-tissue]
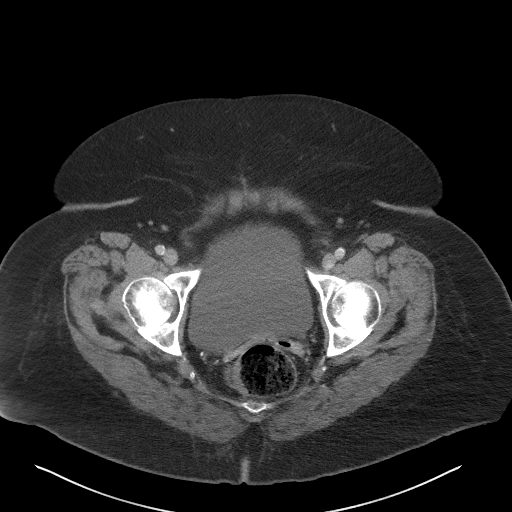
[im 23/88  soft-tissue]
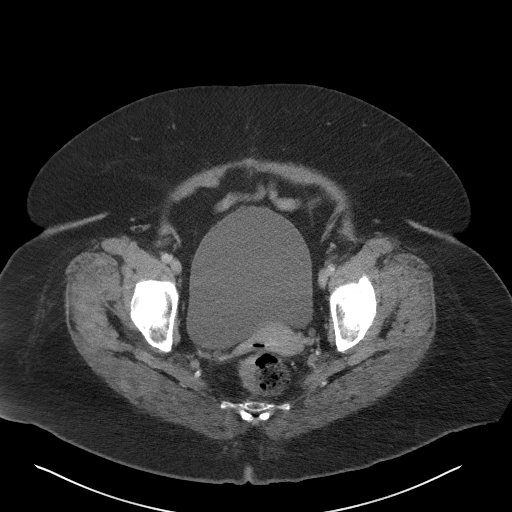
[im 33/88  soft-tissue]
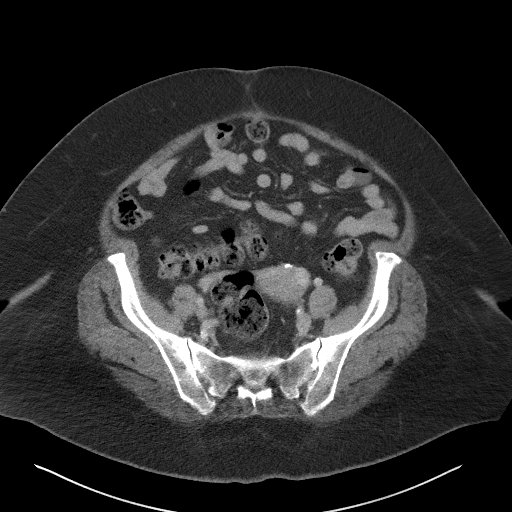
[im 37/88  soft-tissue]
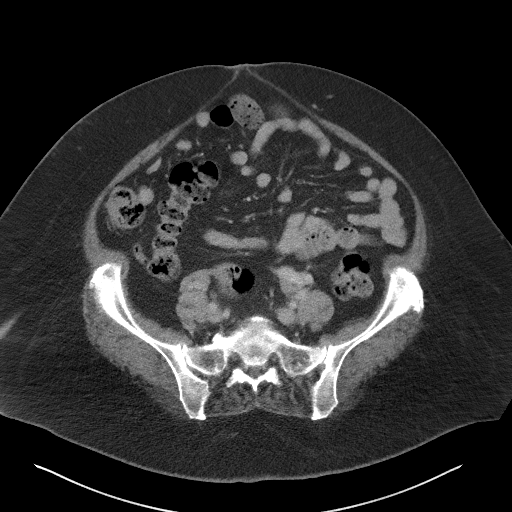
[im 46/88  soft-tissue]
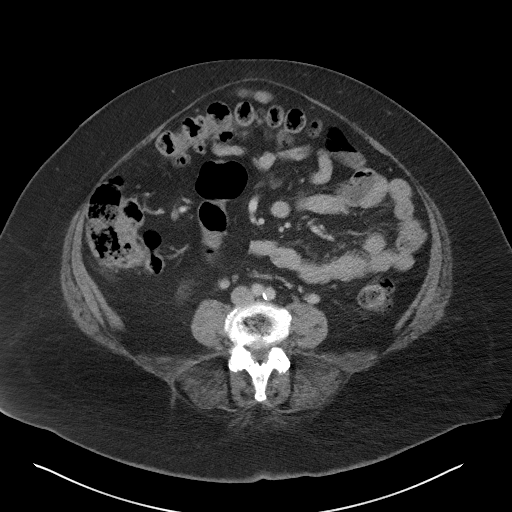
[im 51/88  soft-tissue]
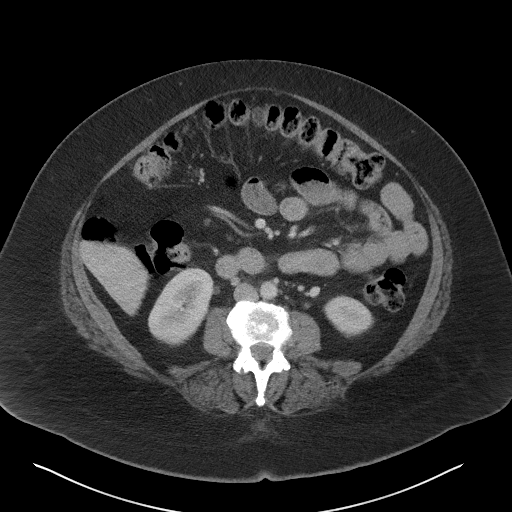
[im 55/88  soft-tissue]
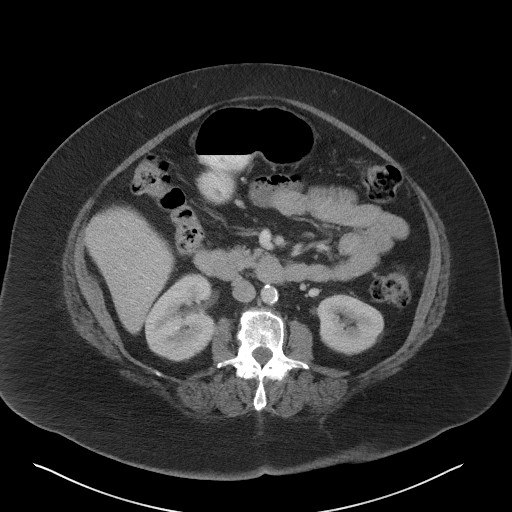
[im 55/88  bone]
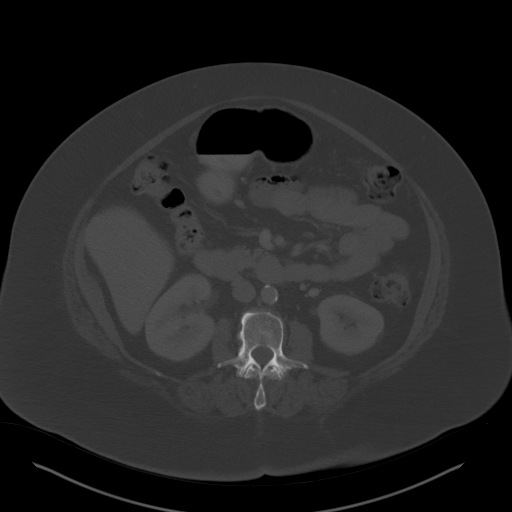
[im 65/88  soft-tissue]
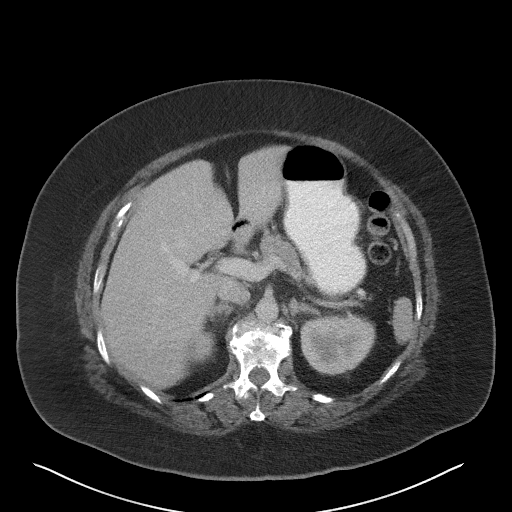
[im 69/88  soft-tissue]
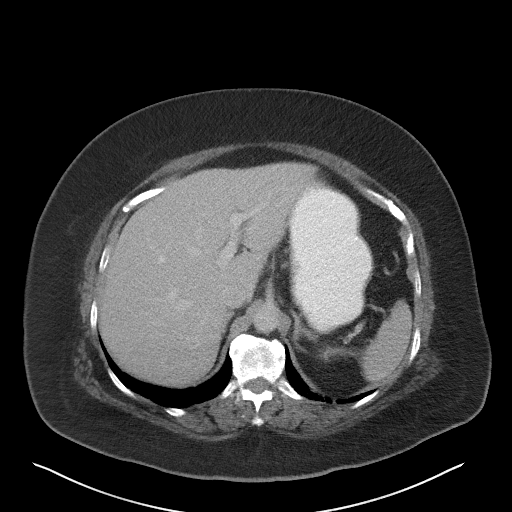
[im 74/88  soft-tissue]
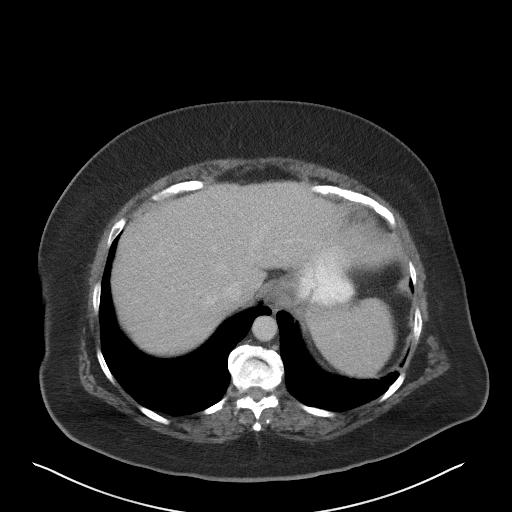
[im 83/88  soft-tissue]
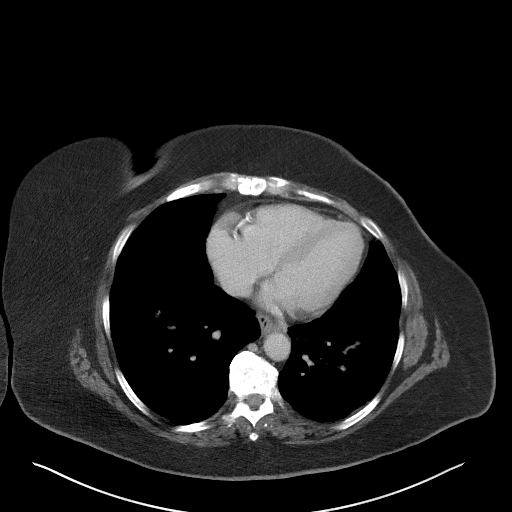

[Series 5: coronal st · coronal · 0.82mm/px · 3 of 113 slices shown]
[im 38/113  soft-tissue]
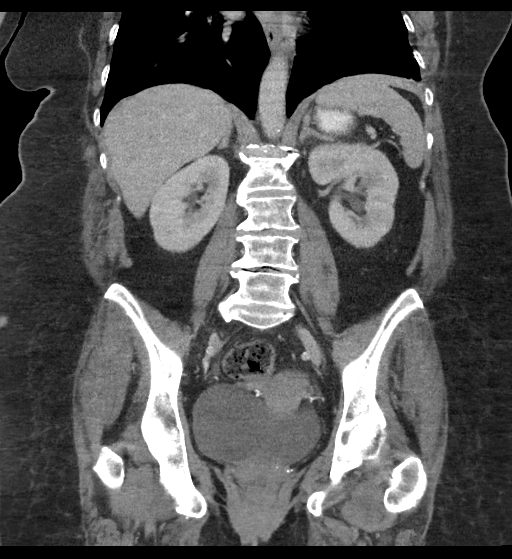
[im 50/113  soft-tissue]
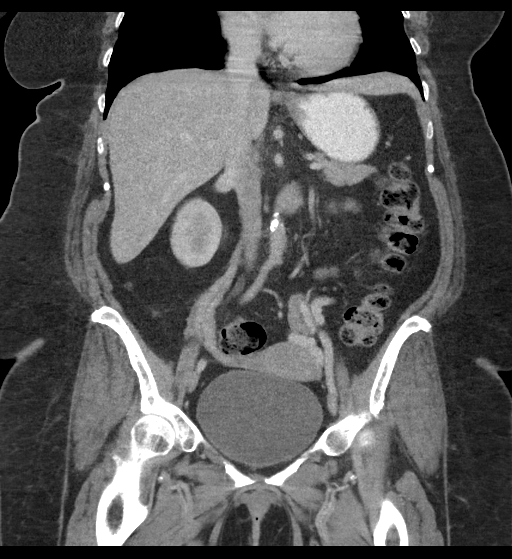
[im 63/113  soft-tissue]
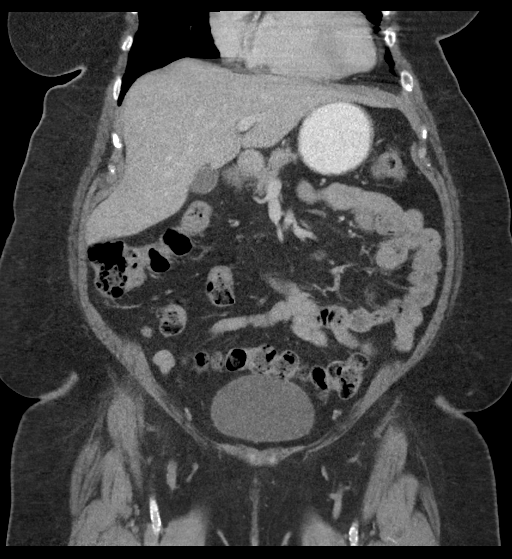

[16 of 46 positions shown; findings below may reference images not displayed]

FINDINGS: Lower chest: Partially included increased soft tissue density in the
left infrahilar region with probable enlarged lymph node,, image 2
series 2. Associated streaky, linear, and ground-glass opacities in
the left lower lobe. Questionable 15 mm pulmonary nodule in the
anteromedial left lower lobe, image 13 series 4. This abuts the
esophagus at the diaphragmatic hiatus. Coronary artery
calcifications.

Hepatobiliary: No focal hepatic lesion. Prominent size liver.
Calcified gallstone in the gallbladder neck. No abnormal gallbladder
distention or pericholecystic inflammation. No biliary dilatation.

Pancreas: No ductal dilatation or inflammation.

Spleen: Normal in size without focal abnormality.

Adrenals/Urinary Tract: Normal adrenal glands. No hydronephrosis or
perinephric edema. Homogeneous renal enhancement with symmetric
excretion on delayed phase imaging. Urinary bladder is
physiologically distended without wall thickening.

Stomach/Bowel: Stomach is unremarkable. No small bowel wall
thickening, inflammatory change, or obstruction. Normal appendix.
Moderate volume of stool throughout the colon. Colonic wall
thickening or inflammatory change. Sigmoid colon is tortuous.

Vascular/Lymphatic: Mild aortic atherosclerosis. No aneurysm. No
enlarged lymph nodes in the abdomen or pelvis.

Reproductive: Prominent periuterine vascularity and dilatation of
the ovarian veins, left greater than right. Left ovarian vein
measures 8 mm, right ovarian vein measures 6 mm. No suspicious
adnexal mass. Unremarkable CT appearance of the uterus.

Other: Small fat containing umbilical hernia with diastasis of
rectus abdominis. Minimal soft tissue density in the right anterior
abdominal wall, nonspecific but may be seen with injection site. No
ascites or free air.

Musculoskeletal: There are no acute or suspicious osseous
abnormalities.
IMPRESSION: 1. No acute abnormality in the abdomen/pelvis.
2. Prominent periuterine vascularity and dilatation of the ovarian
veins, left greater than right, can be seen with pelvic congestion
syndrome in the appropriate clinical setting.
3. Cholelithiasis without gallbladder inflammation.
4. Abnormal left infrahilar soft tissue density in the lung bases
with streaky and ground-glass opacities in the left lower lobe.
Questionable left infrahilar adenopathy and possible 15 mm pulmonary
nodule adjacent to the distal esophagus. Recommend dedicated chest
CT for further evaluation, preferably with IV contrast if renal
function permits.

Aortic Atherosclerosis ([SQ]-[SQ]).

## 2018-12-26 MED ORDER — POTASSIUM CHLORIDE CRYS ER 20 MEQ PO TBCR
40.0000 meq | EXTENDED_RELEASE_TABLET | Freq: Once | ORAL | Status: AC
  Administered 2018-12-26: 40 meq via ORAL
  Filled 2018-12-26: qty 2

## 2018-12-26 MED ORDER — SODIUM CHLORIDE 0.9 % IV SOLN
1.0000 g | Freq: Once | INTRAVENOUS | Status: AC
  Administered 2018-12-26: 1 g via INTRAVENOUS
  Filled 2018-12-26: qty 10

## 2018-12-26 MED ORDER — VITAMIN C 500 MG PO TABS
500.0000 mg | ORAL_TABLET | Freq: Every day | ORAL | Status: DC
  Administered 2018-12-26: 500 mg via ORAL
  Filled 2018-12-26: qty 1

## 2018-12-26 MED ORDER — GUAIFENESIN-DM 100-10 MG/5ML PO SYRP: 10.0000 mL | ORAL_SOLUTION | ORAL | Status: DC | PRN

## 2018-12-26 MED ORDER — SODIUM CHLORIDE 0.9 % IV SOLN
1.0000 g | INTRAVENOUS | Status: DC
  Filled 2018-12-26: qty 10

## 2018-12-26 MED ORDER — IOHEXOL 300 MG/ML  SOLN
100.0000 mL | Freq: Once | INTRAMUSCULAR | Status: AC | PRN
  Administered 2018-12-26: 100 mL via INTRAVENOUS

## 2018-12-26 MED ORDER — IPRATROPIUM-ALBUTEROL 0.5-2.5 (3) MG/3ML IN SOLN: 3.0000 mL | Freq: Four times a day (QID) | RESPIRATORY_TRACT | Status: DC | PRN

## 2018-12-26 MED ORDER — METFORMIN HCL 500 MG PO TABS: 1000.0000 mg | ORAL_TABLET | Freq: Two times a day (BID) | ORAL | Status: DC

## 2018-12-26 MED ORDER — IOHEXOL 300 MG/ML  SOLN
75.0000 mL | Freq: Once | INTRAMUSCULAR | Status: AC | PRN
  Administered 2018-12-26: 75 mL via INTRAVENOUS

## 2018-12-26 MED ORDER — ZINC SULFATE 220 (50 ZN) MG PO CAPS
220.0000 mg | ORAL_CAPSULE | Freq: Every day | ORAL | Status: DC
  Administered 2018-12-26: 220 mg via ORAL
  Filled 2018-12-26: qty 1

## 2018-12-26 MED ORDER — ADULT MULTIVITAMIN W/MINERALS CH
1.0000 | ORAL_TABLET | Freq: Every day | ORAL | Status: DC
  Administered 2018-12-26: 1 via ORAL
  Filled 2018-12-26: qty 1

## 2018-12-26 MED ORDER — ENOXAPARIN SODIUM 40 MG/0.4ML ~~LOC~~ SOLN: 40.0000 mg | SUBCUTANEOUS | Status: DC

## 2018-12-26 MED ORDER — IOHEXOL 240 MG/ML SOLN
50.0000 mL | Freq: Once | INTRAMUSCULAR | Status: AC
  Administered 2018-12-26: 50 mL via ORAL

## 2018-12-26 MED ORDER — SODIUM CHLORIDE 0.9 % IV SOLN: 500.0000 mg | INTRAVENOUS | Status: DC

## 2018-12-26 MED ORDER — METFORMIN HCL 500 MG PO TABS
1000.0000 mg | ORAL_TABLET | Freq: Two times a day (BID) | ORAL | Status: DC
  Administered 2018-12-26 (×2): 1000 mg via ORAL
  Filled 2018-12-26 (×2): qty 2

## 2018-12-26 MED ORDER — INSULIN ASPART 100 UNIT/ML ~~LOC~~ SOLN
0.0000 [IU] | Freq: Three times a day (TID) | SUBCUTANEOUS | Status: DC
  Administered 2018-12-26 (×2): 8 [IU] via SUBCUTANEOUS
  Administered 2018-12-26: 5 [IU] via SUBCUTANEOUS
  Filled 2018-12-26 (×3): qty 1

## 2018-12-26 MED ORDER — PANTOPRAZOLE SODIUM 40 MG PO TBEC
40.0000 mg | DELAYED_RELEASE_TABLET | Freq: Every day | ORAL | Status: DC
  Administered 2018-12-26: 40 mg via ORAL
  Filled 2018-12-26: qty 1

## 2018-12-26 MED ORDER — ENOXAPARIN SODIUM 40 MG/0.4ML ~~LOC~~ SOLN
40.0000 mg | SUBCUTANEOUS | Status: DC
  Administered 2018-12-26: 40 mg via SUBCUTANEOUS
  Filled 2018-12-26: qty 0.4

## 2018-12-26 MED ORDER — PANTOPRAZOLE SODIUM 40 MG IV SOLR
40.0000 mg | INTRAVENOUS | Status: DC
  Filled 2018-12-26: qty 40

## 2018-12-26 MED ORDER — IPRATROPIUM-ALBUTEROL 20-100 MCG/ACT IN AERS
1.0000 | INHALATION_SPRAY | Freq: Four times a day (QID) | RESPIRATORY_TRACT | Status: DC | PRN
  Filled 2018-12-26: qty 4

## 2018-12-26 MED ORDER — SODIUM CHLORIDE 0.9 % IV SOLN: INTRAVENOUS | Status: DC

## 2018-12-26 MED ORDER — SODIUM CHLORIDE 0.9 % IV SOLN: 1.0000 g | INTRAVENOUS | Status: DC

## 2018-12-26 MED ORDER — ONDANSETRON HCL 4 MG/2ML IJ SOLN
4.0000 mg | Freq: Once | INTRAMUSCULAR | Status: AC
  Administered 2018-12-26: 4 mg via INTRAVENOUS
  Filled 2018-12-26: qty 2

## 2018-12-26 MED ORDER — SODIUM CHLORIDE 0.9 % IV BOLUS
1000.0000 mL | Freq: Once | INTRAVENOUS | Status: AC
  Administered 2018-12-26: 1000 mL via INTRAVENOUS

## 2018-12-26 MED ORDER — HYDROCOD POLST-CPM POLST ER 10-8 MG/5ML PO SUER: 5.0000 mL | Freq: Two times a day (BID) | ORAL | Status: DC | PRN

## 2018-12-26 MED ORDER — SERTRALINE HCL 50 MG PO TABS
50.0000 mg | ORAL_TABLET | Freq: Every day | ORAL | Status: DC
  Administered 2018-12-26: 50 mg via ORAL
  Filled 2018-12-26: qty 1

## 2018-12-26 MED ORDER — LACTATED RINGERS IV SOLN
INTRAVENOUS | Status: DC
  Administered 2018-12-26: 10:00:00 via INTRAVENOUS

## 2018-12-26 MED ORDER — SODIUM CHLORIDE 0.9 % IV SOLN
500.0000 mg | INTRAVENOUS | Status: DC
  Filled 2018-12-26: qty 500

## 2018-12-26 MED ORDER — SODIUM CHLORIDE 0.9 % IV SOLN
500.0000 mg | Freq: Once | INTRAVENOUS | Status: AC
  Administered 2018-12-26: 500 mg via INTRAVENOUS
  Filled 2018-12-26: qty 500

## 2018-12-26 MED ORDER — ATORVASTATIN CALCIUM 20 MG PO TABS
40.0000 mg | ORAL_TABLET | Freq: Every day | ORAL | Status: DC
  Administered 2018-12-26: 40 mg via ORAL
  Filled 2018-12-26: qty 2

## 2018-12-26 MED ORDER — INSULIN ASPART 100 UNIT/ML ~~LOC~~ SOLN: 0.0000 [IU] | Freq: Every day | SUBCUTANEOUS | Status: DC

## 2018-12-26 MED ORDER — MORPHINE SULFATE (PF) 4 MG/ML IV SOLN
4.0000 mg | Freq: Once | INTRAVENOUS | Status: AC
  Administered 2018-12-26: 4 mg via INTRAVENOUS
  Filled 2018-12-26: qty 1

## 2018-12-26 NOTE — ED Provider Notes (Signed)
[facility] Emergency Department Provider Note   ____________________________________________   First MD Initiated Contact with Patient 12/26/18 0034     (approximate)  I have reviewed the triage vital signs and the nursing notes.   HISTORY  Chief Complaint Abdominal Pain   HPI Tara Salinas is a 68 y.o. female reports left upper quadrant pain for 2 or 3 days which is sharp and stabbing in nature and goes with loss of appetite nausea and chills.  Pain is worse with palpation.  Seems to be getting worse over the course of time.  Patient has a history of diabetes.         Past Medical History:  Diagnosis Date   Diabetes mellitus without complication (HCC)    GERD (gastroesophageal reflux disease)    Hyperlipemia     Patient Active Problem List   Diagnosis Date Noted   COVID-19 12/26/2018   Abdominal pain 12/26/2018    Past Surgical History:  Procedure Laterality Date   TUBAL LIGATION      Prior to Admission medications   Medication Sig Start Date End Date Taking? Authorizing Provider  atorvastatin (LIPITOR) 40 MG tablet Take 40 mg by mouth daily.    [provider]  HYDROcodone-acetaminophen (NORCO) 5-325 MG tablet Take 1 tablet by mouth every 4 (four) hours as needed for moderate pain. 02/13/18   Evon SlackGaines, Thomas C, PA-C  hydrOXYzine (ATARAX/VISTARIL) 10 MG tablet Take 10 mg by mouth at bedtime as needed (to help patient fall asleep).    [provider]  metFORMIN (GLUCOPHAGE) 1000 MG tablet Take 1,000 mg by mouth 2 (two) times daily with a meal.    [provider]  methylPREDNISolone (MEDROL) 8 MG tablet Take 6 tablets orally on Day 1 Take 5 tablets orally on Day 2 Take 4 tablets orally on Day 3 Take 3 tablets orally on Day 4 Take 2 tablets orally on Day 5 Take 1 tablets orally on Day 6 09/12/16   Phineas SemenGoodman, Graydon, MD  pantoprazole (PROTONIX) 40 MG tablet Take 40 mg by mouth daily.    [provider]  sertraline (ZOLOFT) 50 MG tablet Take 50 mg by mouth daily.    [provider]    Allergies Patient has no known allergies.  No family history on file.  Social History Social History   Tobacco Use   Smoking status: Never Smoker   Smokeless tobacco: Never Used  Substance Use Topics   Alcohol use: No   Drug use: No    Review of Systems  Constitutional: fever/chills Eyes: No visual changes. ENT: No sore throat. Cardiovascular: Denies chest pain. Respiratory: Denies shortness of breath. Gastrointestinal:  abdominal pain.   nausea, no vomiting.  No diarrhea.   constipation. Genitourinary: Negative for dysuria. Musculoskeletal: Negative for back pain. Skin: Negative for rash. Neurological: Negative for headaches, focal weakness   ____________________________________________   PHYSICAL EXAM:  VITAL SIGNS: ED Triage Vitals  Enc Vitals Group     BP 12/25/18 2111 (!) 159/61     Pulse Rate 12/25/18 2111 (!) 103     Resp 12/25/18 2111 18     Temp 12/25/18 2111 (!) 101.6 F (38.7 C)     Temp Source 12/25/18 2111 Oral     SpO2 12/25/18 2111 94 %     Weight --      Height --      Head Circumference --      Peak Flow --  Pain Score 12/25/18 2119 10     Pain Loc --      Pain Edu? --      Excl. in [place]? --     Constitutional: Alert and oriented. Well appearing and in no acute distress. Eyes: Conjunctivae are normal.  Head: Atraumatic. Nose: No congestion/rhinnorhea. Mouth/Throat: Mucous membranes are moist.  Oropharynx non-erythematous. Neck: No stridor. Cardiovascular: Normal rate, regular rhythm. Grossly normal heart sounds.  Good peripheral circulation. Respiratory: Normal respiratory effort.  No retractions. Lungs CTAB. Gastrointestinal: Soft tender to palpation percussion seems to be worse in the left upper quadrant no distention. No abdominal bruits. No CVA tenderness. Musculoskeletal: No lower extremity tenderness nor edema.     Neurologic:  Normal speech and language. No gross focal neurologic deficits are appreciated.  Skin:  Skin is warm, dry and intact. No rash noted.  ____________________________________________   LABS (all labs ordered are listed, but only abnormal results are displayed)  Labs Reviewed  SARS CORONAVIRUS 2 (HOSPITAL ORDER, [place] [facility]) - Abnormal; Notable for the following components:      Result Value   SARS Coronavirus 2 POSITIVE (*)    All other components within normal limits  COMPREHENSIVE METABOLIC PANEL - Abnormal; Notable for the following components:   Sodium 123 (*)    Potassium 3.4 (*)    Chloride 78 (*)    Glucose, Bld 347 (*)    Calcium 8.3 (*)    Albumin 3.4 (*)    Anion gap 19 (*)    All other components within normal limits  CBC - Abnormal; Notable for the following components:   WBC 11.6 (*)    Hemoglobin 11.6 (*)    HCT 34.3 (*)    All other components within normal limits  URINALYSIS, COMPLETE (UACMP) WITH MICROSCOPIC - Abnormal; Notable for the following components:   Color, Urine YELLOW (*)    APPearance HAZY (*)    Glucose, UA >=500 (*)    Protein, ur 30 (*)    Leukocytes,Ua TRACE (*)    Bacteria, UA RARE (*)    All other components within normal limits  BLOOD GAS, VENOUS - Abnormal; Notable for the following components:   pCO2, Ven 63 (*)    Bicarbonate 34.8 (*)    Acid-Base Excess 7.0 (*)    All other components within normal limits  BASIC METABOLIC PANEL - Abnormal; Notable for the following components:   Sodium 125 (*)    Potassium 3.4 (*)    Chloride 80 (*)    Glucose, Bld 314 (*)    Calcium 7.9 (*)    Anion gap 16 (*)    All other components within normal limits  GLUCOSE, CAPILLARY - Abnormal; Notable for the following components:   Glucose-Capillary 284 (*)    All other components within normal limits  CULTURE, BLOOD (ROUTINE X 2)  CULTURE, BLOOD (ROUTINE X 2)  URINE CULTURE  EXPECTORATED SPUTUM ASSESSMENT W  REFEX TO RESP CULTURE  LIPASE, BLOOD  CBC  CREATININE, SERUM  SEDIMENTATION RATE  TRIGLYCERIDES  LACTATE DEHYDROGENASE  INTERLEUKIN-6, PLASMA  C-REACTIVE PROTEIN  FIBRIN DERIVATIVES D-DIMER ([facility] ONLY)  HEMOGLOBIN A1C  ABO/RH   ____________________________________________  EKG EKG read and interpreted by me shows normal sinus rhythm rate of 99 normal axis no acute ST-T changes ____________________________________________  RADIOLOGY  ED MD interpretation: CT of the chest and abdomen read by radiology reviewed by me shows some infiltrate and lymph nodes patient is coronavirus positive this may explain  whole fine.  I reviewed the films.  Official radiology report(s): Ct Chest W Contrast  Result Date: 12/26/2018 CLINICAL DATA:  Abnormal lung bases on abdominal CT. EXAM: CT CHEST WITH CONTRAST TECHNIQUE: Multidetector CT imaging of the chest was performed during intravenous contrast administration. CONTRAST:  75mL OMNIPAQUE IOHEXOL 300 MG/ML  SOLN COMPARISON:  Abdominal CT earlier this day. FINDINGS: Cardiovascular: Aortic atherosclerosis. No aneurysm. No evidence of dissection. Borderline cardiomegaly. Coronary artery calcifications. Minimal fluid in the pericardial recess without significant pericardial effusion. Mediastinum/Nodes: Mild multifocal mediastinal and left hilar adenopathy. Largest lymph node in the subcarinal station measuring 19 mm short axis. Lower paratracheal node measures 13 mm short axis. Upper paratracheal nodes are prominent but subcentimeter. Right supraclavicular node measures 12 mm. There is no axillary adenopathy. Left hilar nodes are not well-defined, however measures approximately 11 mm. There is also ill-defined soft tissue density at the left hilum. Esophagus is decompressed. No visualized thyroid nodule. Lungs/Pleura: Anteromedial left lower lobe nodule measuring 13 mm, series 3 image 103, adjacent to the distal esophagus. Ill-defined left perihilar soft tissue  density without dominant mass, however there is partial obstruction of segmental bronchus in the left lower lobe, series 3, image 77. Associated linear patchy opacities in the more distal segment may be atelectasis, but is nonspecific. Subpleural left upper lobe nodule measuring 6 mm, image 49 series 3. The right lung is clear. No pulmonary edema or pleural fluid. Trachea and mainstem bronchi are patent. Upper Abdomen: Assessed on abdominal CT earlier this day. Musculoskeletal: Degenerative change in the spine. Posterior disc osteophyte complex at T6-T7 causes mass effect and narrowing of the spinal canal. There are no acute or suspicious osseous abnormalities. IMPRESSION: 1. Mild mediastinal, left hilar and right supraclavicular adenopathy. Largest lymph node is subcarinal measuring 19 mm short axis. 2. Additional ill-defined soft tissue density in the left hilum causes partial obstruction of segmental lower lobe bronchus. Distal opacities in the left lower lobe favor atelectasis, but are nonspecific. 3. Anteromedial left lower lobe pulmonary nodule measuring 13 mm. Single additional subpleural left upper lobe 6 mm pulmonary nodule. 4. Recommend lymphoproliferative workup and specialist consultation. PET CT may be of value to assess pulmonary nodule as well as evaluate for left hilar metabolic activity. 5. Aortic atherosclerosis and coronary artery calcifications. Electronically Signed   By: Narda RutherfordMelanie  Sanford M.D.   On: 12/26/2018 03:31   Ct Abdomen Pelvis W Contrast  Result Date: 12/26/2018 CLINICAL DATA:  Acute abdominal pain. EXAM: CT ABDOMEN AND PELVIS WITH CONTRAST TECHNIQUE: Multidetector CT imaging of the abdomen and pelvis was performed using the standard protocol following bolus administration of intravenous contrast. CONTRAST:  100mL OMNIPAQUE IOHEXOL 300 MG/ML  SOLN COMPARISON:  None. FINDINGS: Lower chest: Partially included increased soft tissue density in the left infrahilar region with probable  enlarged lymph node,, image 2 series 2. Associated streaky, linear, and ground-glass opacities in the left lower lobe. Questionable 15 mm pulmonary nodule in the anteromedial left lower lobe, image 13 series 4. This abuts the esophagus at the diaphragmatic hiatus. Coronary artery calcifications. Hepatobiliary: No focal hepatic lesion. Prominent size liver. Calcified gallstone in the gallbladder neck. No abnormal gallbladder distention or pericholecystic inflammation. No biliary dilatation. Pancreas: No ductal dilatation or inflammation. Spleen: Normal in size without focal abnormality. Adrenals/Urinary Tract: Normal adrenal glands. No hydronephrosis or perinephric edema. Homogeneous renal enhancement with symmetric excretion on delayed phase imaging. Urinary bladder is physiologically distended without wall thickening. Stomach/Bowel: Stomach is unremarkable. No small bowel wall thickening, inflammatory change,  or obstruction. Normal appendix. Moderate volume of stool throughout the colon. Colonic wall thickening or inflammatory change. Sigmoid colon is tortuous. Vascular/Lymphatic: Mild aortic atherosclerosis. No aneurysm. No enlarged lymph nodes in the abdomen or pelvis. Reproductive: Prominent periuterine vascularity and dilatation of the ovarian veins, left greater than right. Left ovarian vein measures 8 mm, right ovarian vein measures 6 mm. No suspicious adnexal mass. Unremarkable CT appearance of the uterus. Other: Small fat containing umbilical hernia with diastasis of rectus abdominis. Minimal soft tissue density in the right anterior abdominal wall, nonspecific but may be seen with injection site. No ascites or free air. Musculoskeletal: There are no acute or suspicious osseous abnormalities. IMPRESSION: 1. No acute abnormality in the abdomen/pelvis. 2. Prominent periuterine vascularity and dilatation of the ovarian veins, left greater than right, can be seen with pelvic congestion syndrome in the  appropriate clinical setting. 3. Cholelithiasis without gallbladder inflammation. 4. Abnormal left infrahilar soft tissue density in the lung bases with streaky and ground-glass opacities in the left lower lobe. Questionable left infrahilar adenopathy and possible 15 mm pulmonary nodule adjacent to the distal esophagus. Recommend dedicated chest CT for further evaluation, preferably with IV contrast if renal function permits. Aortic Atherosclerosis (ICD10-I70.0). Electronically Signed   By: Narda RutherfordMelanie  Sanford M.D.   On: 12/26/2018 02:11    ____________________________________________   PROCEDURES  Procedure(s) performed (including Critical Care):  Procedures   ____________________________________________   INITIAL IMPRESSION / ASSESSMENT AND PLAN / ED COURSE  Tara Salinas was evaluated in Emergency Department on 12/26/2018 for the symptoms described in the history of present illness. She was evaluated in the context of the global COVID-19 pandemic, which necessitated consideration that the patient might be at risk for infection with the SARS-CoV-2 virus that causes COVID-19. Institutional protocols and algorithms that pertain to the evaluation of patients at risk for COVID-19 are in a state of rapid change based on information released by regulatory bodies including the CDC and federal and state organizations. These policies and algorithms were followed during the patient's care in the ED.      ____________________________________________   FINAL CLINICAL IMPRESSION(S) / ED DIAGNOSES  Final diagnoses:  Hyponatremia  Hyperglycemia  Community acquired pneumonia, unspecified laterality     ED Discharge Orders    None       Note:  This document was prepared using Dragon voice recognition software and may include unintentional dictation errors.    Arnaldo NatalMalinda, Aracelia Brinson F, MD 12/26/18 [phone]

## 2018-12-26 NOTE — Discharge Summary (Signed)
[facility] [place] at [facility]   PATIENT NAME: Tara Salinas    MR#:  [id number]  DATE OF BIRTH:  [birth year]  DATE OF ADMISSION:  12/26/2018 ADMITTING PHYSICIAN: Pearletha Alfred, NP  DATE OF DISCHARGE: 12/26/2018  PRIMARY CARE PHYSICIAN: [facility]   ADMISSION DIAGNOSIS:  Hyponatremia [E87.1] Hyperglycemia [R73.9] PNA (pneumonia) [J18.9] Community acquired pneumonia, unspecified laterality [J18.9] COVID-19 [U07.1, J98.8]  DISCHARGE DIAGNOSIS:  Active Problems:   COVID-19   Abdominal pain   SECONDARY DIAGNOSIS:   Past Medical History:  Diagnosis Date  . Diabetes mellitus without complication (HCC)   . GERD (gastroesophageal reflux disease)   . Hyperlipemia      ADMITTING HISTORY  Tara Salinas  is a 68 y.o. female with a known history of diabetes.  She presented to the emergency room complaining of left upper quadrant abdominal pain present for 6 days described as sharp and stabbing in nature.  She has also noted a loss of appetite as well as nausea and vomiting with occasional fevers and chills.  On palpation, I notice only tenderness in her bilateral lower quadrants of her abdomen.  She denies experiencing diarrhea or constipation.  She denies hematemesis, hematochezia, or melena.  She denies chest pain.  She has noted some mild shortness of breath and nonproductive cough.  She denies headache.  She has noted some fatigue however no generalized weakness.  She is unaware of COVID positive contacts.  However 2 people who live in her household were sick approximately 2 weeks ago and were tested for COVID.  She reports there test was negative at that time.  Rapid COVID-19 testing is positive CT abdomen demonstrates no acute abnormalities.  CT chest was completed demonstrating mild mediastinal left hilar and right supraclavicular adenopathy as well as additional ill-defined soft tissue density in the left hilum with partial  obstruction of subsegmental lower lobe bronchus.  There are distal opacities in the left lower lobe favoring atelectasis however are nonspecific.  There is also an anterior medial left lower lobe pulmonary nodule measuring 13 mm and a single additional subpleural left upper lobe 6 mm pulmonary nodule.  There is recommended a lymphoproliferative work-up and specialist consultation.  Urinalysis demonstrates 11-20 WBCs, rare bacteria, and trace leukocytes.  Labs demonstrate sodium of 125.   History of present illness has been obtained using an interpreter at the bedside.  HOSPITAL COURSE:   * COVID-19 pneumonia Started on IV antibiotics for possible bacterial infection Blood and sputum cultures ordered and pending IV fluids Discussed with Dr. Thedore Mins and [facility].  Graciously accepted in transfer for further care. Patient is not hypoxic and I have not started steroids or edematous aware. Labs ordered and pending. Tmax 101.6  *  Diabetes mellitus - sliding scale insulin -Metformin hel -Hemoglobin A1c-pending -diabetic diet  * Hyponatremia Received normal saline bolus in the emergency room.  Changed to lactated Ringer's as requested by Dr. Thedore Mins  * Abnormal chest CT-with significant lymphadenopathy Will need pulmonary follow-up  Patient is stable for transfer to [facility] for further care.  CONSULTS OBTAINED:    DRUG ALLERGIES:  No Known Allergies  DISCHARGE MEDICATIONS:   Allergies as of 12/26/2018   No Known Allergies     Medication List    TAKE these medications   atorvastatin 40 MG tablet Commonly known as: LIPITOR Take 40 mg by mouth daily.   hydrOXYzine 10 MG tablet Commonly known as: ATARAX/VISTARIL Take 10 mg by  mouth at bedtime as needed (to help patient fall asleep).   metFORMIN 1000 MG tablet Commonly known as: GLUCOPHAGE Take 1,000 mg by mouth 2 (two) times daily with a meal.   pantoprazole 40 MG tablet Commonly known as:  PROTONIX Take 40 mg by mouth daily.   sertraline 50 MG tablet Commonly known as: ZOLOFT Take 50 mg by mouth daily.       Today   VITAL SIGNS:  Blood pressure (!) 148/72, pulse 87, temperature 98.6 F (37 C), temperature source Oral, resp. rate 19, weight 91.4 kg, SpO2 95 %.  I/O:    Intake/Output Summary (Last 24 hours) at 12/26/2018 1340 Last data filed at 12/26/2018 0515 Gross per 24 hour  Intake 182.4 ml  Output -  Net 182.4 ml    PHYSICAL EXAMINATION:  Physical Exam  GENERAL:  68 y.o.-year-old patient lying in the bed with no acute distress.  LUNGS: Normal breath sounds bilaterally, no wheezing, rales,rhonchi or crepitation. No use of accessory muscles of respiration.  CARDIOVASCULAR: S1, S2 normal. No murmurs, rubs, or gallops.  ABDOMEN: Soft, non-tender, non-distended. Bowel sounds present. No organomegaly or mass.  NEUROLOGIC: Moves all 4 extremities. PSYCHIATRIC: The patient is alert and oriented x 3.  SKIN: No obvious rash, lesion, or ulcer.   DATA REVIEW:   CBC Recent Labs  Lab 12/26/18 1227  WBC 9.4  HGB 11.5*  HCT 34.6*  PLT 312    Chemistries  Recent Labs  Lab 12/25/18 2158  12/26/18 1227  NA 123*   < > 128*  K 3.4*   < > 3.2*  CL 78*   < > 90*  CO2 26   < > 28  GLUCOSE 347*   < > 253*  BUN 15   < > 10  CREATININE 0.57   < > 0.57  CALCIUM 8.3*   < > 8.3*  AST 24  --   --   ALT 25  --   --   ALKPHOS 94  --   --   BILITOT 0.4  --   --    < > = values in this interval not displayed.    Cardiac Enzymes No results for input(s): TROPONINI in the last 168 hours.  Microbiology Results  Results for orders placed or performed during the hospital encounter of 12/26/18  SARS Coronavirus 2 [facility](Hospital order, Performed in [facility] hospital lab) Nasopharyngeal     Status: Abnormal   Collection Time: 12/26/18  2:21 AM   Specimen: Nasopharyngeal  Result Value Ref Range Status   SARS Coronavirus 2 POSITIVE (A) NEGATIVE Final    Comment: RESULT  CALLED TO, READ BACK BY AND VERIFIED WITH: LEIGH FERGUSON ON 12/26/18 AT 0411 [facility] (NOTE) If result is NEGATIVE SARS-CoV-2 target nucleic acids are NOT DETECTED. The SARS-CoV-2 RNA is generally detectable in upper and lower  respiratory specimens during the acute phase of infection. The lowest  concentration of SARS-CoV-2 viral copies this assay can detect is 250  copies / mL. A negative result does not preclude SARS-CoV-2 infection  and should not be used as the sole basis for treatment or other  patient management decisions.  A negative result may occur with  improper specimen collection / handling, submission of specimen other  than nasopharyngeal swab, presence of viral mutation(s) within the  areas targeted by this assay, and inadequate number of viral copies  (<250 copies / mL). A negative result must be combined with clinical  observations, patient history, and epidemiological  information. If result is POSITIVE SARS-CoV-2 target nucleic acids are DETECTED.  The SARS-CoV-2 RNA is generally detectable in upper and lower  respiratory specimens during the acute phase of infection.  Positive  results are indicative of active infection with SARS-CoV-2.  Clinical  correlation with patient history and other diagnostic information is  necessary to determine patient infection status.  Positive results do  not rule out bacterial infection or co-infection with other viruses. If result is PRESUMPTIVE POSTIVE SARS-CoV-2 nucleic acids MAY BE PRESENT.   A presumptive positive result was obtained on the submitted specimen  and confirmed on repeat testing.  While 2019 novel coronavirus  (SARS-CoV-2) nucleic acids may be present in the submitted sample  additional confirmatory testing may be necessary for epidemiological  and / or clinical management purposes  to differentiate between  SARS-CoV-2 and other Sarbecovirus currently known to infect humans.  If clinically indicated additional testing  with an alternate test  methodology [phone]([id number])  is advised. The SARS-CoV-2 RNA is generally  detectable in upper and lower respiratory specimens during the acute  phase of infection. The expected result is Negative. Fact Sheet for Patients:  BoilerBrush.com.cyhttps://www.fda.gov/media/136312/download Fact Sheet for Healthcare Providers: https://pope.com/https://www.fda.gov/media/136313/download This test is not yet approved or cleared by the [place] FDA and has been authorized for detection and/or diagnosis of SARS-CoV-2 by FDA under an Emergency Use Authorization (EUA).  This EUA will remain in effect (meaning this test can be used) for the duration of the COVID-19 declaration under Section 564(b)(1) of the Act, 21 U.S.C. section 360bbb-3(b)(1), unless the authorization is terminated or revoked sooner. Performed at [facility], [address] [place], [place] [address]   CULTURE, BLOOD (ROUTINE X 2) w Reflex to ID Panel     Status: None (Preliminary result)   Collection Time: 12/26/18  5:00 AM   Specimen: BLOOD  Result Value Ref Range Status   Specimen Description BLOOD RIGHT ANTECUBITAL  Final   Special Requests   Final    BOTTLES DRAWN AEROBIC AND ANAEROBIC Blood Culture adequate volume   Culture   Final    NO GROWTH <12 HOURS Performed at [facility], [address] [place], [place] [address]    Report Status PENDING  Incomplete  CULTURE, BLOOD (ROUTINE X 2) w Reflex to ID Panel     Status: None (Preliminary result)   Collection Time: 12/26/18  5:30 AM   Specimen: BLOOD  Result Value Ref Range Status   Specimen Description BLOOD RIGHT HAND  Final   Special Requests   Final    BOTTLES DRAWN AEROBIC AND ANAEROBIC Blood Culture adequate volume   Culture   Final    NO GROWTH <12 HOURS Performed at [facility], [address] [place], [place] [address]    Report Status PENDING  Incomplete    RADIOLOGY:  Ct Chest W Contrast  Result Date: 12/26/2018 CLINICAL DATA:   Abnormal lung bases on abdominal CT. EXAM: CT CHEST WITH CONTRAST TECHNIQUE: Multidetector CT imaging of the chest was performed during intravenous contrast administration. CONTRAST:  75mL OMNIPAQUE IOHEXOL 300 MG/ML  SOLN COMPARISON:  Abdominal CT earlier this day. FINDINGS: Cardiovascular: Aortic atherosclerosis. No aneurysm. No evidence of dissection. Borderline cardiomegaly. Coronary artery calcifications. Minimal fluid in the pericardial recess without significant pericardial effusion. Mediastinum/Nodes: Mild multifocal mediastinal and left hilar adenopathy. Largest lymph node in the subcarinal station measuring 19 mm short axis. Lower paratracheal node measures 13 mm short axis. Upper paratracheal nodes are prominent but subcentimeter.  Right supraclavicular node measures 12 mm. There is no axillary adenopathy. Left hilar nodes are not well-defined, however measures approximately 11 mm. There is also ill-defined soft tissue density at the left hilum. Esophagus is decompressed. No visualized thyroid nodule. Lungs/Pleura: Anteromedial left lower lobe nodule measuring 13 mm, series 3 image 103, adjacent to the distal esophagus. Ill-defined left perihilar soft tissue density without dominant mass, however there is partial obstruction of segmental bronchus in the left lower lobe, series 3, image 77. Associated linear patchy opacities in the more distal segment may be atelectasis, but is nonspecific. Subpleural left upper lobe nodule measuring 6 mm, image 49 series 3. The right lung is clear. No pulmonary edema or pleural fluid. Trachea and mainstem bronchi are patent. Upper Abdomen: Assessed on abdominal CT earlier this day. Musculoskeletal: Degenerative change in the spine. Posterior disc osteophyte complex at T6-T7 causes mass effect and narrowing of the spinal canal. There are no acute or suspicious osseous abnormalities. IMPRESSION: 1. Mild mediastinal, left hilar and right supraclavicular adenopathy. Largest  lymph node is subcarinal measuring 19 mm short axis. 2. Additional ill-defined soft tissue density in the left hilum causes partial obstruction of segmental lower lobe bronchus. Distal opacities in the left lower lobe favor atelectasis, but are nonspecific. 3. Anteromedial left lower lobe pulmonary nodule measuring 13 mm. Single additional subpleural left upper lobe 6 mm pulmonary nodule. 4. Recommend lymphoproliferative workup and specialist consultation. PET CT may be of value to assess pulmonary nodule as well as evaluate for left hilar metabolic activity. 5. Aortic atherosclerosis and coronary artery calcifications. Electronically Signed   By: Keith Rake M.D.   On: 12/26/2018 03:31   Ct Abdomen Pelvis W Contrast  Result Date: 12/26/2018 CLINICAL DATA:  Acute abdominal pain. EXAM: CT ABDOMEN AND PELVIS WITH CONTRAST TECHNIQUE: Multidetector CT imaging of the abdomen and pelvis was performed using the standard protocol following bolus administration of intravenous contrast. CONTRAST:  145mL OMNIPAQUE IOHEXOL 300 MG/ML  SOLN COMPARISON:  None. FINDINGS: Lower chest: Partially included increased soft tissue density in the left infrahilar region with probable enlarged lymph node,, image 2 series 2. Associated streaky, linear, and ground-glass opacities in the left lower lobe. Questionable 15 mm pulmonary nodule in the anteromedial left lower lobe, image 13 series 4. This abuts the esophagus at the diaphragmatic hiatus. Coronary artery calcifications. Hepatobiliary: No focal hepatic lesion. Prominent size liver. Calcified gallstone in the gallbladder neck. No abnormal gallbladder distention or pericholecystic inflammation. No biliary dilatation. Pancreas: No ductal dilatation or inflammation. Spleen: Normal in size without focal abnormality. Adrenals/Urinary Tract: Normal adrenal glands. No hydronephrosis or perinephric edema. Homogeneous renal enhancement with symmetric excretion on delayed phase imaging.  Urinary bladder is physiologically distended without wall thickening. Stomach/Bowel: Stomach is unremarkable. No small bowel wall thickening, inflammatory change, or obstruction. Normal appendix. Moderate volume of stool throughout the colon. Colonic wall thickening or inflammatory change. Sigmoid colon is tortuous. Vascular/Lymphatic: Mild aortic atherosclerosis. No aneurysm. No enlarged lymph nodes in the abdomen or pelvis. Reproductive: Prominent periuterine vascularity and dilatation of the ovarian veins, left greater than right. Left ovarian vein measures 8 mm, right ovarian vein measures 6 mm. No suspicious adnexal mass. Unremarkable CT appearance of the uterus. Other: Small fat containing umbilical hernia with diastasis of rectus abdominis. Minimal soft tissue density in the right anterior abdominal wall, nonspecific but may be seen with injection site. No ascites or free air. Musculoskeletal: There are no acute or suspicious osseous abnormalities. IMPRESSION: 1. No acute abnormality in the  abdomen/pelvis. 2. Prominent periuterine vascularity and dilatation of the ovarian veins, left greater than right, can be seen with pelvic congestion syndrome in the appropriate clinical setting. 3. Cholelithiasis without gallbladder inflammation. 4. Abnormal left infrahilar soft tissue density in the lung bases with streaky and ground-glass opacities in the left lower lobe. Questionable left infrahilar adenopathy and possible 15 mm pulmonary nodule adjacent to the distal esophagus. Recommend dedicated chest CT for further evaluation, preferably with IV contrast if renal function permits. Aortic Atherosclerosis (ICD10-I70.0). Electronically Signed   By: Narda RutherfordMelanie  Sanford M.D.   On: 12/26/2018 02:11    Follow up with PCP in 1 week.  Management plans discussed with the patient, family and they are in agreement.  CODE STATUS:     Code Status Orders  (From admission, onward)         Start     Ordered   12/26/18  0837  Full code  Continuous     12/26/18 0836        Code Status History    Date Active Date Inactive Code Status Order ID Comments User Context   12/26/2018 0507 12/26/2018 0836 Full Code [id number]  Pearletha AlfredSeals, Angela H, NP ED   Advance Care Planning Activity      TOTAL TIME TAKING CARE OF THIS PATIENT ON DAY OF DISCHARGE: more than 30 minutes.   Molinda BailiffSrikar R Glorian Mcdonell M.D on 12/26/2018 at 1:40 PM  Between 7am to 6pm - Pager - [phone]  After 6pm go to [email or web address] - password [email or web address] [facility]  [facility]  Office  [phone]  CC: Primary care physician; [facility]  Note: This dictation was prepared with Nurse, children'sDragon dictation along with smaller Lobbyistphrase technology. Any transcriptional errors that result from this process are unintentional.

## 2018-12-26 NOTE — ED Notes (Signed)
ED TO INPATIENT HANDOFF REPORT  ED Nurse Name and Phone #:  Reuel BoomDaniel [phone]  S Name/Age/Gender Tara CelesteBalvina Beltran Salinas 68 y.o. female Room/Bed: ED19A/ED19A  Code Status   Code Status: Full Code  Home/SNF/Other Home Patient oriented to: self, place, time and situation Is this baseline? Yes   Triage Complete: Triage complete  Chief Complaint Fever/abd pain  Triage Note Per video translator. Patient c/o left to right abdominal pain for several days. Patient denies constipation, but reports burning with urination. Patient states pain radiates to back. Patient reports poor appetite and several episodes of vomiting. Patient reports having a fever. Denies shortness of breath or cough.   Allergies No Known Allergies  Level of Care/Admitting Diagnosis ED Disposition    ED Disposition Condition Comment   Admit  [facility] [[id number]]  Level of Care: Med-Surg [16]  Covid Evaluation: Confirmed COVID Positive  Diagnosis: COVID-19 [[id number]][[id number]]  Admitting Physician: Pearletha AlfredSEALS, ANGELA H [[id number]]  Attending Physician: Pearletha AlfredSEALS, ANGELA H [[id number]]  Estimated length of stay: past midnight tomorrow  Certification:: I certify this patient will need inpatient services for at least 2 midnights  PT Class (Do Not Modify): Inpatient [101]  PT Acc Code (Do Not Modify): Private [1]       B Medical/Surgery History Past Medical History:  Diagnosis Date  . Diabetes mellitus without complication (HCC)   . GERD (gastroesophageal reflux disease)   . Hyperlipemia    Past Surgical History:  Procedure Laterality Date  . TUBAL LIGATION       A IV Location/Drains/Wounds Patient Lines/Drains/Airways Status   Active Line/Drains/Airways    Name:   Placement date:   Placement time:   Site:   Days:   Peripheral IV 12/26/18 Left Antecubital   12/26/18    0100    Antecubital   less than 1          Intake/Output Last 24 hours  Intake/Output Summary (Last 24  hours) at 12/26/2018 [id number] Last data filed at 12/26/2018 [id number] Gross per 24 hour  Intake 100 ml  Output -  Net 100 ml    Labs/Imaging Results for orders placed or performed during the hospital encounter of 12/26/18 (from the past 48 hour(s))  Lipase, blood     Status: None   Collection Time: 12/25/18  9:58 PM  Result Value Ref Range   Lipase 27 11 - 51 U/L    Comment: Performed at [facility], [address] [place], [place] [address]  Comprehensive metabolic panel     Status: Abnormal   Collection Time: 12/25/18  9:58 PM  Result Value Ref Range   Sodium 123 (L) 135 - 145 mmol/L   Potassium 3.4 (L) 3.5 - 5.1 mmol/L   Chloride 78 (L) 98 - 111 mmol/L   CO2 26 22 - 32 mmol/L   Glucose, Bld 347 (H) 70 - 99 mg/dL   BUN 15 8 - 23 mg/dL   Creatinine, Ser 4.010.57 0.44 - 1.00 mg/dL   Calcium 8.3 (L) 8.9 - 10.3 mg/dL   Total Protein 8.0 6.5 - 8.1 g/dL   Albumin 3.4 (L) 3.5 - 5.0 g/dL   AST 24 15 - 41 U/L   ALT 25 0 - 44 U/L   Alkaline Phosphatase 94 38 - 126 U/L   Total Bilirubin 0.4 0.3 - 1.2 mg/dL   GFR calc non Af Amer >60 >60 mL/min   GFR calc Af Amer >60 >60 mL/min   Anion gap 19 (H)  5 - 15    Comment: Performed at [facility], [address] [place], [place] [address]  CBC     Status: Abnormal   Collection Time: 12/25/18  9:58 PM  Result Value Ref Range   WBC 11.6 (H) 4.0 - 10.5 K/uL   RBC 4.29 3.87 - 5.11 MIL/uL   Hemoglobin 11.6 (L) 12.0 - 15.0 g/dL   HCT 60.434.3 (L) 54.036.0 - 98.146.0 %   MCV 80.0 80.0 - 100.0 fL   MCH 27.0 26.0 - 34.0 pg   MCHC 33.8 30.0 - 36.0 g/dL   RDW 19.113.2 47.811.5 - 29.515.5 %   Platelets 306 150 - 400 K/uL   nRBC 0.0 0.0 - 0.2 %    Comment: Performed at [facility], [address] [place], [place] [address]  Blood gas, venous     Status: Abnormal (Preliminary result)   Collection Time: 12/26/18 12:44 AM  Result Value Ref Range   FIO2 PENDING    pH, Ven 7.35 7.250 - 7.430   pCO2, Ven 63 (H) 44.0 - 60.0 mmHg   pO2, Ven PENDING  32.0 - 45.0 mmHg   Bicarbonate 34.8 (H) 20.0 - 28.0 mmol/L   Acid-Base Excess 7.0 (H) 0.0 - 2.0 mmol/L   O2 Saturation 39.0 %   Patient temperature 37.0    Collection site VENOUS    Sample type VENOUS     Comment: Performed at [facility], [address] [place], [place] [address]   Mechanical Rate PENDING   Urinalysis, Complete w Microscopic     Status: Abnormal   Collection Time: 12/26/18  2:20 AM  Result Value Ref Range   Color, Urine YELLOW (A) YELLOW   APPearance HAZY (A) CLEAR   Specific Gravity, Urine 1.018 1.005 - 1.030   pH 6.0 5.0 - 8.0   Glucose, UA >=500 (A) NEGATIVE mg/dL   Hgb urine dipstick NEGATIVE NEGATIVE   Bilirubin Urine NEGATIVE NEGATIVE   Ketones, ur NEGATIVE NEGATIVE mg/dL   Protein, ur 30 (A) NEGATIVE mg/dL   Nitrite NEGATIVE NEGATIVE   Leukocytes,Ua TRACE (A) NEGATIVE   RBC / HPF 0-5 0 - 5 RBC/hpf   WBC, UA 11-20 0 - 5 WBC/hpf   Bacteria, UA RARE (A) NONE SEEN   Squamous Epithelial / LPF 6-10 0 - 5   Mucus PRESENT     Comment: Performed at [facility], [address] [place], [place] [address]  Basic metabolic panel     Status: Abnormal   Collection Time: 12/26/18  2:21 AM  Result Value Ref Range   Sodium 125 (L) 135 - 145 mmol/L   Potassium 3.4 (L) 3.5 - 5.1 mmol/L   Chloride 80 (L) 98 - 111 mmol/L   CO2 29 22 - 32 mmol/L   Glucose, Bld 314 (H) 70 - 99 mg/dL   BUN 13 8 - 23 mg/dL   Creatinine, Ser 9.520.54 0.44 - 1.00 mg/dL   Calcium 7.9 (L) 8.9 - 10.3 mg/dL   GFR calc non Af Amer >60 >60 mL/min   GFR calc Af Amer >60 >60 mL/min   Anion gap 16 (H) 5 - 15    Comment: Performed at [facility], [address] [place], [place] [address]  SARS Coronavirus 2 [facility](Hospital order, Performed in [facility] hospital lab) Nasopharyngeal     Status: Abnormal   Collection Time: 12/26/18  2:21 AM   Specimen: Nasopharyngeal  Result Value Ref Range   SARS Coronavirus 2 POSITIVE (A)  NEGATIVE    Comment: RESULT CALLED TO, READ BACK  BY AND VERIFIED WITH: LEIGH FERGUSON ON 12/26/18 AT 0411 [facility] (NOTE) If result is NEGATIVE SARS-CoV-2 target nucleic acids are NOT DETECTED. The SARS-CoV-2 RNA is generally detectable in upper and lower  respiratory specimens during the acute phase of infection. The lowest  concentration of SARS-CoV-2 viral copies this assay can detect is 250  copies / mL. A negative result does not preclude SARS-CoV-2 infection  and should not be used as the sole basis for treatment or other  patient management decisions.  A negative result may occur with  improper specimen collection / handling, submission of specimen other  than nasopharyngeal swab, presence of viral mutation(s) within the  areas targeted by this assay, and inadequate number of viral copies  (<250 copies / mL). A negative result must be combined with clinical  observations, patient history, and epidemiological information. If result is POSITIVE SARS-CoV-2 target nucleic acids are DETECTED.  The SARS-CoV-2 RNA is generally detectable in upper and lower  respiratory specimens during the acute phase of infection.  Positive  results are indicative of active infection with SARS-CoV-2.  Clinical  correlation with patient history and other diagnostic information is  necessary to determine patient infection status.  Positive results do  not rule out bacterial infection or co-infection with other viruses. If result is PRESUMPTIVE POSTIVE SARS-CoV-2 nucleic acids MAY BE PRESENT.   A presumptive positive result was obtained on the submitted specimen  and confirmed on repeat testing.  While 2019 novel coronavirus  (SARS-CoV-2) nucleic acids may be present in the submitted sample  additional confirmatory testing may be necessary for epidemiological  and / or clinical management purposes  to differentiate between  SARS-CoV-2 and other Sarbecovirus currently known to infect humans.  If clinically indicated additional testing with an alternate test   methodology [phone])  is advised. The SARS-CoV-2 RNA is generally  detectable in upper and lower respiratory specimens during the acute  phase of infection. The expected result is Negative. Fact Sheet for Patients:  BoilerBrush.com.cy Fact Sheet for Healthcare Providers: https://pope.com/ This test is not yet approved or cleared by the [place] FDA and has been authorized for detection and/or diagnosis of SARS-CoV-2 by FDA under an Emergency Use Authorization (EUA).  This EUA will remain in effect (meaning this test can be used) for the duration of the COVID-19 declaration under Section 564(b)(1) of the Act, 21 U.S.C. section 360bbb-3(b)(1), unless the authorization is terminated or revoked sooner. Performed at [facility], [address] [place], [place] [address]    Ct Chest W Contrast  Result Date: 12/26/2018 CLINICAL DATA:  Abnormal lung bases on abdominal CT. EXAM: CT CHEST WITH CONTRAST TECHNIQUE: Multidetector CT imaging of the chest was performed during intravenous contrast administration. CONTRAST:  75mL OMNIPAQUE IOHEXOL 300 MG/ML  SOLN COMPARISON:  Abdominal CT earlier this day. FINDINGS: Cardiovascular: Aortic atherosclerosis. No aneurysm. No evidence of dissection. Borderline cardiomegaly. Coronary artery calcifications. Minimal fluid in the pericardial recess without significant pericardial effusion. Mediastinum/Nodes: Mild multifocal mediastinal and left hilar adenopathy. Largest lymph node in the subcarinal station measuring 19 mm short axis. Lower paratracheal node measures 13 mm short axis. Upper paratracheal nodes are prominent but subcentimeter. Right supraclavicular node measures 12 mm. There is no axillary adenopathy. Left hilar nodes are not well-defined, however measures approximately 11 mm. There is also ill-defined soft tissue density at the left hilum. Esophagus is decompressed. No visualized thyroid  nodule. Lungs/Pleura: Anteromedial left lower  lobe nodule measuring 13 mm, series 3 image 103, adjacent to the distal esophagus. Ill-defined left perihilar soft tissue density without dominant mass, however there is partial obstruction of segmental bronchus in the left lower lobe, series 3, image 77. Associated linear patchy opacities in the more distal segment may be atelectasis, but is nonspecific. Subpleural left upper lobe nodule measuring 6 mm, image 49 series 3. The right lung is clear. No pulmonary edema or pleural fluid. Trachea and mainstem bronchi are patent. Upper Abdomen: Assessed on abdominal CT earlier this day. Musculoskeletal: Degenerative change in the spine. Posterior disc osteophyte complex at T6-T7 causes mass effect and narrowing of the spinal canal. There are no acute or suspicious osseous abnormalities. IMPRESSION: 1. Mild mediastinal, left hilar and right supraclavicular adenopathy. Largest lymph node is subcarinal measuring 19 mm short axis. 2. Additional ill-defined soft tissue density in the left hilum causes partial obstruction of segmental lower lobe bronchus. Distal opacities in the left lower lobe favor atelectasis, but are nonspecific. 3. Anteromedial left lower lobe pulmonary nodule measuring 13 mm. Single additional subpleural left upper lobe 6 mm pulmonary nodule. 4. Recommend lymphoproliferative workup and specialist consultation. PET CT may be of value to assess pulmonary nodule as well as evaluate for left hilar metabolic activity. 5. Aortic atherosclerosis and coronary artery calcifications. Electronically Signed   By: Narda RutherfordMelanie  Sanford M.D.   On: 12/26/2018 03:31   Ct Abdomen Pelvis W Contrast  Result Date: 12/26/2018 CLINICAL DATA:  Acute abdominal pain. EXAM: CT ABDOMEN AND PELVIS WITH CONTRAST TECHNIQUE: Multidetector CT imaging of the abdomen and pelvis was performed using the standard protocol following bolus administration of intravenous contrast. CONTRAST:  100mL  OMNIPAQUE IOHEXOL 300 MG/ML  SOLN COMPARISON:  None. FINDINGS: Lower chest: Partially included increased soft tissue density in the left infrahilar region with probable enlarged lymph node,, image 2 series 2. Associated streaky, linear, and ground-glass opacities in the left lower lobe. Questionable 15 mm pulmonary nodule in the anteromedial left lower lobe, image 13 series 4. This abuts the esophagus at the diaphragmatic hiatus. Coronary artery calcifications. Hepatobiliary: No focal hepatic lesion. Prominent size liver. Calcified gallstone in the gallbladder neck. No abnormal gallbladder distention or pericholecystic inflammation. No biliary dilatation. Pancreas: No ductal dilatation or inflammation. Spleen: Normal in size without focal abnormality. Adrenals/Urinary Tract: Normal adrenal glands. No hydronephrosis or perinephric edema. Homogeneous renal enhancement with symmetric excretion on delayed phase imaging. Urinary bladder is physiologically distended without wall thickening. Stomach/Bowel: Stomach is unremarkable. No small bowel wall thickening, inflammatory change, or obstruction. Normal appendix. Moderate volume of stool throughout the colon. Colonic wall thickening or inflammatory change. Sigmoid colon is tortuous. Vascular/Lymphatic: Mild aortic atherosclerosis. No aneurysm. No enlarged lymph nodes in the abdomen or pelvis. Reproductive: Prominent periuterine vascularity and dilatation of the ovarian veins, left greater than right. Left ovarian vein measures 8 mm, right ovarian vein measures 6 mm. No suspicious adnexal mass. Unremarkable CT appearance of the uterus. Other: Small fat containing umbilical hernia with diastasis of rectus abdominis. Minimal soft tissue density in the right anterior abdominal wall, nonspecific but may be seen with injection site. No ascites or free air. Musculoskeletal: There are no acute or suspicious osseous abnormalities. IMPRESSION: 1. No acute abnormality in the  abdomen/pelvis. 2. Prominent periuterine vascularity and dilatation of the ovarian veins, left greater than right, can be seen with pelvic congestion syndrome in the appropriate clinical setting. 3. Cholelithiasis without gallbladder inflammation. 4. Abnormal left infrahilar soft tissue density in the lung bases with  streaky and ground-glass opacities in the left lower lobe. Questionable left infrahilar adenopathy and possible 15 mm pulmonary nodule adjacent to the distal esophagus. Recommend dedicated chest CT for further evaluation, preferably with IV contrast if renal function permits. Aortic Atherosclerosis (ICD10-I70.0). Electronically Signed   By: Narda Rutherford M.D.   On: 12/26/2018 02:11    Pending Labs Unresulted Labs (From admission, onward)    Start     Ordered   01/02/19 0500  Creatinine, serum  (enoxaparin (LOVENOX)    CrCl >/= 30 ml/min)  Weekly,   STAT    Comments: while on enoxaparin therapy    12/26/18 0507   12/27/18 0500  CBC with Differential/Platelet  Daily,   STAT     12/26/18 0507   12/27/18 0500  Comprehensive metabolic panel  Daily,   STAT     12/26/18 0507   12/26/18 0505  Lactate dehydrogenase  Once,   STAT     12/26/18 0507   12/26/18 0505  Interleukin-6, Plasma  Once,   STAT     12/26/18 0507   12/26/18 0505  C-reactive protein  Once,   STAT     12/26/18 0507   12/26/18 0505  Fibrin derivatives D-Dimer ([facility] only)  Once,   STAT     12/26/18 0507   12/26/18 0504  Sedimentation rate  Once,   STAT     12/26/18 0507   12/26/18 0504  Triglycerides  Once,   STAT     12/26/18 0507   12/26/18 0503  ABO/Rh  Once,   STAT     12/26/18 0507   12/26/18 0501  CBC  (enoxaparin (LOVENOX)    CrCl >/= 30 ml/min)  Once,   STAT    Comments: Baseline for enoxaparin therapy IF NOT ALREADY DRAWN.  Notify MD if PLT < 100 K.    12/26/18 0507   12/26/18 0501  Creatinine, serum  (enoxaparin (LOVENOX)    CrCl >/= 30 ml/min)  Once,   STAT    Comments: Baseline for enoxaparin  therapy IF NOT ALREADY DRAWN.    12/26/18 0507   12/26/18 0414  CULTURE, BLOOD (ROUTINE X 2) w Reflex to ID Panel  BLOOD CULTURE X 2,   STAT    Question:  Patient immune status  Answer:  Normal   12/26/18 0413   12/26/18 0414  Urine Culture  Once,   STAT    Question:  Patient immune status  Answer:  Normal   12/26/18 0413   Signed and Held  HIV antibody (Routine Testing)  Once,   R     Signed and Held   Signed and Held  CBC  (enoxaparin (LOVENOX)    CrCl >/= 30 ml/min)  Once,   R    Comments: Baseline for enoxaparin therapy IF NOT ALREADY DRAWN.  Notify MD if PLT < 100 K.    Signed and Held   Signed and Held  Creatinine, serum  (enoxaparin (LOVENOX)    CrCl >/= 30 ml/min)  Once,   R    Comments: Baseline for enoxaparin therapy IF NOT ALREADY DRAWN.    Signed and Held   Signed and Held  Creatinine, serum  (enoxaparin (LOVENOX)    CrCl >/= 30 ml/min)  Weekly,   R    Comments: while on enoxaparin therapy    Signed and Held   Signed and Held  Basic metabolic panel  Tomorrow morning,   R     Signed and Held  Signed and Held  CBC  Tomorrow morning,   R     Signed and Held          Vitals/Pain Today's Vitals   12/26/18 0207 12/26/18 0239 12/26/18 0430 12/26/18 0436  BP: (!) 146/73 (!) 144/74 139/68   Pulse: 80 90 83   Resp:   17   Temp:      TempSrc:      SpO2: 93% 98% 100%   PainSc:    0-No pain    Isolation Precautions Airborne and Contact precautions  Medications Medications  azithromycin (ZITHROMAX) 500 mg in sodium chloride 0.9 % 250 mL IVPB (500 mg Intravenous New Bag/Given 12/26/18 0436)  azithromycin (ZITHROMAX) 500 mg in sodium chloride 0.9 % 250 mL IVPB (has no administration in time range)  cefTRIAXone (ROCEPHIN) 1 g in sodium chloride 0.9 % 100 mL IVPB (has no administration in time range)  ipratropium-albuterol (DUONEB) 0.5-2.5 (3) MG/3ML nebulizer solution 3 mL (has no administration in time range)  enoxaparin (LOVENOX) injection 40 mg (has no  administration in time range)  0.9 %  sodium chloride infusion (has no administration in time range)  ipratropium-albuterol (DUONEB) 0.5-2.5 (3) MG/3ML nebulizer solution 3 mL (has no administration in time range)  guaiFENesin-dextromethorphan (ROBITUSSIN DM) 100-10 MG/5ML syrup 10 mL (has no administration in time range)  chlorpheniramine-HYDROcodone (TUSSIONEX) 10-8 MG/5ML suspension 5 mL (has no administration in time range)  vitamin C (ASCORBIC ACID) tablet 500 mg (has no administration in time range)  zinc sulfate capsule 220 mg (has no administration in time range)  multivitamin with minerals tablet 1 tablet (has no administration in time range)  ondansetron (ZOFRAN-ODT) disintegrating tablet 4 mg (4 mg Oral Given 12/25/18 2145)  acetaminophen (TYLENOL) tablet 650 mg (650 mg Oral Given 12/25/18 2145)  sodium chloride 0.9 % bolus 1,000 mL (0 mLs Intravenous Stopped 12/26/18 0243)  morphine 4 MG/ML injection 4 mg (4 mg Intravenous Given 12/26/18 0108)  ondansetron (ZOFRAN) injection 4 mg (4 mg Intravenous Given 12/26/18 0108)  iohexol (OMNIPAQUE) 240 MG/ML injection 50 mL (50 mLs Oral Contrast Given 12/26/18 0107)  iohexol (OMNIPAQUE) 300 MG/ML solution 100 mL (100 mLs Intravenous Contrast Given 12/26/18 0150)  iohexol (OMNIPAQUE) 300 MG/ML solution 75 mL (75 mLs Intravenous Contrast Given 12/26/18 0254)  cefTRIAXone (ROCEPHIN) 1 g in sodium chloride 0.9 % 100 mL IVPB (0 g Intravenous Stopped 12/26/18 0438)    Mobility walks Moderate fall risk   Focused Assessments Pulmonary Assessment Handoff:  Lung sounds:  Clear O2 Device: Room Air        R Recommendations: See Admitting Provider Note  Report given to:   Additional Notes:

## 2018-12-26 NOTE — Progress Notes (Signed)
Tara Salinas, is a 68 y.o. female, DOB - 05/17/1950, ZOX:[id number]   Patient with history of DM type II, GERD and dyslipidemia presenting to the ER with nausea vomiting diarrhea for a few days diagnosed with COVID-19 gastroenteritis, dehydration and hyponatremia.  Coming here for further treatment.  Currently not hypoxic.     Vitals:   12/26/18 0430 12/26/18 0640 12/26/18 0641 12/26/18 0651  BP: 139/68 136/64 136/84   Pulse: 83  84 80  Resp: 17  18 15   Temp:      TempSrc:      SpO2: 100%  95% 97%        Data Review   Micro Results Recent Results (from the past 240 hour(s))  SARS Coronavirus 2 [facility](Hospital order, Performed in [facility] lab) Nasopharyngeal     Status: Abnormal   Collection Time: 12/26/18  2:21 AM   Specimen: Nasopharyngeal  Result Value Ref Range Status   SARS Coronavirus 2 POSITIVE (A) NEGATIVE Final    Comment: RESULT CALLED TO, READ BACK BY AND VERIFIED WITH: LEIGH FERGUSON ON 12/26/18 AT 0411 [facility] (NOTE) If result is NEGATIVE SARS-CoV-2 target nucleic acids are NOT DETECTED. The SARS-CoV-2 RNA is generally detectable in upper and lower  respiratory specimens during the acute phase of infection. The lowest  concentration of SARS-CoV-2 viral copies this assay can detect is 250  copies / mL. A negative result does not preclude SARS-CoV-2 infection  and should not be used as the sole basis for treatment or other  patient management decisions.  A negative result may occur with  improper specimen collection / handling, submission of specimen other  than nasopharyngeal swab, presence of viral mutation(s) within the  areas targeted by this assay, and inadequate number of viral copies  (<250 copies / mL). A negative result must be combined with clinical  observations, patient history, and epidemiological information. If result is POSITIVE SARS-CoV-2 target  nucleic acids are DETECTED.  The SARS-CoV-2 RNA is generally detectable in upper and lower  respiratory specimens during the acute phase of infection.  Positive  results are indicative of active infection with SARS-CoV-2.  Clinical  correlation with patient history and other diagnostic information is  necessary to determine patient infection status.  Positive results do  not rule out bacterial infection or co-infection with other viruses. If result is PRESUMPTIVE POSTIVE SARS-CoV-2 nucleic acids MAY BE PRESENT.   A presumptive positive result was obtained on the submitted specimen  and confirmed on repeat testing.  While 2019 novel coronavirus  (SARS-CoV-2) nucleic acids may be present in the submitted sample  additional confirmatory testing may be necessary for epidemiological  and / or clinical management purposes  to differentiate between  SARS-CoV-2 and other Sarbecovirus currently known to infect humans.  If clinically indicated additional testing with an alternate test  methodology [phone]([id number])  is advised. The SARS-CoV-2 RNA is generally  detectable in upper and lower respiratory specimens during the acute  phase of infection. The expected result is Negative. Fact Sheet for Patients:  BoilerBrush.com.cyhttps://www.fda.gov/media/136312/download Fact Sheet for Healthcare Providers: https://pope.com/https://www.fda.gov/media/136313/download This test is not yet approved or cleared by the [place] FDA and has been authorized for detection and/or diagnosis of SARS-CoV-2 by FDA under an Emergency Use Authorization (EUA).  This EUA will remain in effect (meaning this test can be used) for the duration of the COVID-19 declaration under Section 564(b)(1) of the Act, 21 U.S.C. section 360bbb-3(b)(1), unless the authorization is terminated or revoked sooner. Performed  at [facility], [address] [place], [place] [address]   CULTURE, BLOOD (ROUTINE X 2) w Reflex to ID Panel     Status: None (Preliminary  result)   Collection Time: 12/26/18  5:00 AM   Specimen: BLOOD  Result Value Ref Range Status   Specimen Description BLOOD RIGHT ANTECUBITAL  Final   Special Requests   Final    BOTTLES DRAWN AEROBIC AND ANAEROBIC Blood Culture adequate volume   Culture   Final    NO GROWTH <12 HOURS Performed at [facility], [address], [place], [place] [address]    Report Status PENDING  Incomplete  CULTURE, BLOOD (ROUTINE X 2) w Reflex to ID Panel     Status: None (Preliminary result)   Collection Time: 12/26/18  5:30 AM   Specimen: BLOOD  Result Value Ref Range Status   Specimen Description BLOOD RIGHT HAND  Final   Special Requests   Final    BOTTLES DRAWN AEROBIC AND ANAEROBIC Blood Culture adequate volume   Culture   Final    NO GROWTH <12 HOURS Performed at [facility], [address] [place], [place] [address]    Report Status PENDING  Incomplete    Radiology Reports Ct Chest W Contrast  Result Date: 12/26/2018 CLINICAL DATA:  Abnormal lung bases on abdominal CT. EXAM: CT CHEST WITH CONTRAST TECHNIQUE: Multidetector CT imaging of the chest was performed during intravenous contrast administration. CONTRAST:  56mL OMNIPAQUE IOHEXOL 300 MG/ML  SOLN COMPARISON:  Abdominal CT earlier this day. FINDINGS: Cardiovascular: Aortic atherosclerosis. No aneurysm. No evidence of dissection. Borderline cardiomegaly. Coronary artery calcifications. Minimal fluid in the pericardial recess without significant pericardial effusion. Mediastinum/Nodes: Mild multifocal mediastinal and left hilar adenopathy. Largest lymph node in the subcarinal station measuring 19 mm short axis. Lower paratracheal node measures 13 mm short axis. Upper paratracheal nodes are prominent but subcentimeter. Right supraclavicular node measures 12 mm. There is no axillary adenopathy. Left hilar nodes are not well-defined, however measures approximately 11 mm. There is also ill-defined soft tissue density at the  left hilum. Esophagus is decompressed. No visualized thyroid nodule. Lungs/Pleura: Anteromedial left lower lobe nodule measuring 13 mm, series 3 image 103, adjacent to the distal esophagus. Ill-defined left perihilar soft tissue density without dominant mass, however there is partial obstruction of segmental bronchus in the left lower lobe, series 3, image 77. Associated linear patchy opacities in the more distal segment may be atelectasis, but is nonspecific. Subpleural left upper lobe nodule measuring 6 mm, image 49 series 3. The right lung is clear. No pulmonary edema or pleural fluid. Trachea and mainstem bronchi are patent. Upper Abdomen: Assessed on abdominal CT earlier this day. Musculoskeletal: Degenerative change in the spine. Posterior disc osteophyte complex at T6-T7 causes mass effect and narrowing of the spinal canal. There are no acute or suspicious osseous abnormalities. IMPRESSION: 1. Mild mediastinal, left hilar and right supraclavicular adenopathy. Largest lymph node is subcarinal measuring 19 mm short axis. 2. Additional ill-defined soft tissue density in the left hilum causes partial obstruction of segmental lower lobe bronchus. Distal opacities in the left lower lobe favor atelectasis, but are nonspecific. 3. Anteromedial left lower lobe pulmonary nodule measuring 13 mm. Single additional subpleural left upper lobe 6 mm pulmonary nodule. 4. Recommend lymphoproliferative workup and specialist consultation. PET CT may be of value to assess pulmonary nodule as well as evaluate for left hilar metabolic activity. 5. Aortic atherosclerosis and coronary artery calcifications. Electronically Signed   By:  Narda RutherfordMelanie  Sanford M.D.   On: 12/26/2018 03:31   Ct Abdomen Pelvis W Contrast  Result Date: 12/26/2018 CLINICAL DATA:  Acute abdominal pain. EXAM: CT ABDOMEN AND PELVIS WITH CONTRAST TECHNIQUE: Multidetector CT imaging of the abdomen and pelvis was performed using the standard protocol following bolus  administration of intravenous contrast. CONTRAST:  100mL OMNIPAQUE IOHEXOL 300 MG/ML  SOLN COMPARISON:  None. FINDINGS: Lower chest: Partially included increased soft tissue density in the left infrahilar region with probable enlarged lymph node,, image 2 series 2. Associated streaky, linear, and ground-glass opacities in the left lower lobe. Questionable 15 mm pulmonary nodule in the anteromedial left lower lobe, image 13 series 4. This abuts the esophagus at the diaphragmatic hiatus. Coronary artery calcifications. Hepatobiliary: No focal hepatic lesion. Prominent size liver. Calcified gallstone in the gallbladder neck. No abnormal gallbladder distention or pericholecystic inflammation. No biliary dilatation. Pancreas: No ductal dilatation or inflammation. Spleen: Normal in size without focal abnormality. Adrenals/Urinary Tract: Normal adrenal glands. No hydronephrosis or perinephric edema. Homogeneous renal enhancement with symmetric excretion on delayed phase imaging. Urinary bladder is physiologically distended without wall thickening. Stomach/Bowel: Stomach is unremarkable. No small bowel wall thickening, inflammatory change, or obstruction. Normal appendix. Moderate volume of stool throughout the colon. Colonic wall thickening or inflammatory change. Sigmoid colon is tortuous. Vascular/Lymphatic: Mild aortic atherosclerosis. No aneurysm. No enlarged lymph nodes in the abdomen or pelvis. Reproductive: Prominent periuterine vascularity and dilatation of the ovarian veins, left greater than right. Left ovarian vein measures 8 mm, right ovarian vein measures 6 mm. No suspicious adnexal mass. Unremarkable CT appearance of the uterus. Other: Small fat containing umbilical hernia with diastasis of rectus abdominis. Minimal soft tissue density in the right anterior abdominal wall, nonspecific but may be seen with injection site. No ascites or free air. Musculoskeletal: There are no acute or suspicious osseous  abnormalities. IMPRESSION: 1. No acute abnormality in the abdomen/pelvis. 2. Prominent periuterine vascularity and dilatation of the ovarian veins, left greater than right, can be seen with pelvic congestion syndrome in the appropriate clinical setting. 3. Cholelithiasis without gallbladder inflammation. 4. Abnormal left infrahilar soft tissue density in the lung bases with streaky and ground-glass opacities in the left lower lobe. Questionable left infrahilar adenopathy and possible 15 mm pulmonary nodule adjacent to the distal esophagus. Recommend dedicated chest CT for further evaluation, preferably with IV contrast if renal function permits. Aortic Atherosclerosis (ICD10-I70.0). Electronically Signed   By: Narda RutherfordMelanie  Sanford M.D.   On: 12/26/2018 02:11    CBC Recent Labs  Lab 12/25/18 2158  WBC 11.6*  HGB 11.6*  HCT 34.3*  PLT 306  MCV 80.0  MCH 27.0  MCHC 33.8  RDW 13.2    Chemistries  Recent Labs  Lab 12/25/18 2158 12/26/18 0221  NA 123* 125*  K 3.4* 3.4*  CL 78* 80*  CO2 26 29  GLUCOSE 347* 314*  BUN 15 13  CREATININE 0.57 0.54  CALCIUM 8.3* 7.9*  AST 24  --   ALT 25  --   ALKPHOS 94  --   BILITOT 0.4  --    ------------------------------------------------------------------------------------------------------------------ CrCl cannot be calculated (Unknown ideal weight.). ------------------------------------------------------------------------------------------------------------------ No results for input(s): HGBA1C in the last 72 hours. ------------------------------------------------------------------------------------------------------------------ No results for input(s): CHOL, HDL, LDLCALC, TRIG, CHOLHDL, LDLDIRECT in the last 72 hours. ------------------------------------------------------------------------------------------------------------------ No results for input(s): TSH, T4TOTAL, T3FREE, THYROIDAB in the last 72 hours.  Invalid input(s):  FREET3 ------------------------------------------------------------------------------------------------------------------ No results for input(s): VITAMINB12, FOLATE, FERRITIN, TIBC, IRON, RETICCTPCT in the last 72  hours.  Coagulation profile No results for input(s): INR, PROTIME in the last 168 hours.  No results for input(s): DDIMER in the last 72 hours.  Cardiac Enzymes No results for input(s): CKMB, TROPONINI, MYOGLOBIN in the last 168 hours.  Invalid input(s): CK ------------------------------------------------------------------------------------------------------------------ Invalid input(s): POCBNP

## 2018-12-26 NOTE — Progress Notes (Signed)
Patient was transfer to [facility], report given to receiving nurse at this time , patient left via care link

## 2018-12-26 NOTE — H&P (Signed)
[employer] [facility]   PATIENT NAME: Tara Salinas    MR#:  [id number]  DATE OF BIRTH:  08/24/1950  DATE OF ADMISSION:  12/26/2018  PRIMARY CARE PHYSICIAN: [facility]   REQUESTING/REFERRING PHYSICIAN: Dorothea GlassmanPaul Malinda, MD  CHIEF COMPLAINT:   Chief Complaint  Patient presents with   Abdominal Pain    HISTORY OF PRESENT ILLNESS:  Tara Salinas  is a 68 y.o. female with a known history of diabetes.  She presented to the emergency room complaining of left upper quadrant abdominal pain present for 6 days described as sharp and stabbing in nature.  She has also noted a loss of appetite as well as nausea and vomiting with occasional fevers and chills.  On palpation, I notice only tenderness in her bilateral lower quadrants of her abdomen.  She denies experiencing diarrhea or constipation.  She denies hematemesis, hematochezia, or melena.  She denies chest pain.  She has noted some mild shortness of breath and nonproductive cough.  She denies headache.  She has noted some fatigue however no generalized weakness.  She is unaware of COVID positive contacts.  However 2 people who live in her household were sick approximately 2 weeks ago and were tested for COVID.  She reports there test was negative at that time.  Rapid COVID-19 testing is positive CT abdomen demonstrates no acute abnormalities.  CT chest was completed demonstrating mild mediastinal left hilar and right supraclavicular adenopathy as well as additional ill-defined soft tissue density in the left hilum with partial obstruction of subsegmental lower lobe bronchus.  There are distal opacities in the left lower lobe favoring atelectasis however are nonspecific.  There is also an anterior medial left lower lobe pulmonary nodule measuring 13 mm and a single additional subpleural left upper lobe 6 mm pulmonary nodule.  There is recommended a lymphoproliferative work-up and  specialist consultation.  Urinalysis demonstrates 11-20 WBCs, rare bacteria, and trace leukocytes.  Labs demonstrate sodium of 125.   History of present illness has been obtained using an interpreter at the bedside.  PAST MEDICAL HISTORY:   Past Medical History:  Diagnosis Date   Diabetes mellitus without complication (HCC)    GERD (gastroesophageal reflux disease)    Hyperlipemia     PAST SURGICAL HISTORY:   Past Surgical History:  Procedure Laterality Date   TUBAL LIGATION      SOCIAL HISTORY:   Social History   Tobacco Use   Smoking status: Never Smoker   Smokeless tobacco: Never Used  Substance Use Topics   Alcohol use: No    FAMILY HISTORY:  No family history on file.  DRUG ALLERGIES:  No Known Allergies  REVIEW OF SYSTEMS:   Review of Systems  Constitutional: Positive for chills, fever and malaise/fatigue.  HENT: Negative for congestion and sore throat.   Eyes: Negative for blurred vision and double vision.  Respiratory: Positive for cough and shortness of breath. Negative for sputum production and wheezing.   Cardiovascular: Negative for chest pain and palpitations.  Gastrointestinal: Positive for abdominal pain, nausea and vomiting. Negative for blood in stool, constipation, diarrhea, heartburn and melena.  Genitourinary: Negative for dysuria, flank pain and hematuria.  Musculoskeletal: Negative for falls and myalgias.  Skin: Negative for itching and rash.  Neurological: Negative for dizziness, weakness and headaches.  Psychiatric/Behavioral: Negative for depression.      MEDICATIONS AT HOME:   Prior to Admission medications   Medication Sig Start Date End Date  Taking? Authorizing Provider  atorvastatin (LIPITOR) 40 MG tablet Take 40 mg by mouth daily.    [provider]  HYDROcodone-acetaminophen (NORCO) 5-325 MG tablet Take 1 tablet by mouth every 4 (four) hours as needed for moderate pain. 02/13/18   Evon SlackGaines, Thomas C, PA-C    hydrOXYzine (ATARAX/VISTARIL) 10 MG tablet Take 10 mg by mouth at bedtime as needed (to help patient fall asleep).    [provider]  metFORMIN (GLUCOPHAGE) 1000 MG tablet Take 1,000 mg by mouth 2 (two) times daily with a meal.    [provider]  methylPREDNISolone (MEDROL) 8 MG tablet Take 6 tablets orally on Day 1 Take 5 tablets orally on Day 2 Take 4 tablets orally on Day 3 Take 3 tablets orally on Day 4 Take 2 tablets orally on Day 5 Take 1 tablets orally on Day 6 09/12/16   Phineas SemenGoodman, Graydon, MD  pantoprazole (PROTONIX) 40 MG tablet Take 40 mg by mouth daily.    [provider]  sertraline (ZOLOFT) 50 MG tablet Take 50 mg by mouth daily.    [provider]      VITAL SIGNS:  Blood pressure 139/68, pulse 83, temperature (!) 101.6 F (38.7 C), temperature source Oral, resp. rate 17, SpO2 100 %.  PHYSICAL EXAMINATION:  Physical Exam  GENERAL:  68 y.o.-year-old patient lying in the bed with no acute distress.  EYES: Pupils equal, round, reactive to light and accommodation. No scleral icterus. Extraocular muscles intact.  HEENT: Head atraumatic, normocephalic. Oropharynx and nasopharynx clear.  NECK:  Supple, no jugular venous distention. No thyroid enlargement, no tenderness.  LUNGS: Normal breath sounds bilaterally, no wheezing, rales,rhonchi or crepitation. No use of accessory muscles of respiration.  CARDIOVASCULAR: Regular rate and rhythm, S1, S2 normal. No murmurs, rubs, or gallops.  ABDOMEN: Soft, nondistended,mild lower abdominal tenderness. Bowel sounds present. No organomegaly or mass.  EXTREMITIES: No pedal edema, cyanosis, or clubbing.  NEUROLOGIC: Cranial nerves II through XII are intact. Muscle strength 5/5 in all extremities. Sensation intact. Gait not checked.  PSYCHIATRIC: The patient is alert and oriented x 3.  Normal affect and good eye contact. SKIN: No obvious rash, lesion, or ulcer.   LABORATORY PANEL:   CBC Recent Labs   Lab 12/25/18 2158  WBC 11.6*  HGB 11.6*  HCT 34.3*  PLT 306   ------------------------------------------------------------------------------------------------------------------  Chemistries  Recent Labs  Lab 12/25/18 2158 12/26/18 0221  NA 123* 125*  K 3.4* 3.4*  CL 78* 80*  CO2 26 29  GLUCOSE 347* 314*  BUN 15 13  CREATININE 0.57 0.54  CALCIUM 8.3* 7.9*  AST 24  --   ALT 25  --   ALKPHOS 94  --   BILITOT 0.4  --    ------------------------------------------------------------------------------------------------------------------  Cardiac Enzymes No results for input(s): TROPONINI in the last 168 hours. ------------------------------------------------------------------------------------------------------------------  RADIOLOGY:  Ct Chest W Contrast  Result Date: 12/26/2018 CLINICAL DATA:  Abnormal lung bases on abdominal CT. EXAM: CT CHEST WITH CONTRAST TECHNIQUE: Multidetector CT imaging of the chest was performed during intravenous contrast administration. CONTRAST:  75mL OMNIPAQUE IOHEXOL 300 MG/ML  SOLN COMPARISON:  Abdominal CT earlier this day. FINDINGS: Cardiovascular: Aortic atherosclerosis. No aneurysm. No evidence of dissection. Borderline cardiomegaly. Coronary artery calcifications. Minimal fluid in the pericardial recess without significant pericardial effusion. Mediastinum/Nodes: Mild multifocal mediastinal and left hilar adenopathy. Largest lymph node in the subcarinal station measuring 19 mm short axis. Lower paratracheal node measures 13 mm short axis. Upper paratracheal nodes are prominent but  subcentimeter. Right supraclavicular node measures 12 mm. There is no axillary adenopathy. Left hilar nodes are not well-defined, however measures approximately 11 mm. There is also ill-defined soft tissue density at the left hilum. Esophagus is decompressed. No visualized thyroid nodule. Lungs/Pleura: Anteromedial left lower lobe nodule measuring 13 mm, series 3 image  103, adjacent to the distal esophagus. Ill-defined left perihilar soft tissue density without dominant mass, however there is partial obstruction of segmental bronchus in the left lower lobe, series 3, image 77. Associated linear patchy opacities in the more distal segment may be atelectasis, but is nonspecific. Subpleural left upper lobe nodule measuring 6 mm, image 49 series 3. The right lung is clear. No pulmonary edema or pleural fluid. Trachea and mainstem bronchi are patent. Upper Abdomen: Assessed on abdominal CT earlier this day. Musculoskeletal: Degenerative change in the spine. Posterior disc osteophyte complex at T6-T7 causes mass effect and narrowing of the spinal canal. There are no acute or suspicious osseous abnormalities. IMPRESSION: 1. Mild mediastinal, left hilar and right supraclavicular adenopathy. Largest lymph node is subcarinal measuring 19 mm short axis. 2. Additional ill-defined soft tissue density in the left hilum causes partial obstruction of segmental lower lobe bronchus. Distal opacities in the left lower lobe favor atelectasis, but are nonspecific. 3. Anteromedial left lower lobe pulmonary nodule measuring 13 mm. Single additional subpleural left upper lobe 6 mm pulmonary nodule. 4. Recommend lymphoproliferative workup and specialist consultation. PET CT may be of value to assess pulmonary nodule as well as evaluate for left hilar metabolic activity. 5. Aortic atherosclerosis and coronary artery calcifications. Electronically Signed   By: Narda RutherfordMelanie  Sanford M.D.   On: 12/26/2018 03:31   Ct Abdomen Pelvis W Contrast  Result Date: 12/26/2018 CLINICAL DATA:  Acute abdominal pain. EXAM: CT ABDOMEN AND PELVIS WITH CONTRAST TECHNIQUE: Multidetector CT imaging of the abdomen and pelvis was performed using the standard protocol following bolus administration of intravenous contrast. CONTRAST:  100mL OMNIPAQUE IOHEXOL 300 MG/ML  SOLN COMPARISON:  None. FINDINGS: Lower chest: Partially  included increased soft tissue density in the left infrahilar region with probable enlarged lymph node,, image 2 series 2. Associated streaky, linear, and ground-glass opacities in the left lower lobe. Questionable 15 mm pulmonary nodule in the anteromedial left lower lobe, image 13 series 4. This abuts the esophagus at the diaphragmatic hiatus. Coronary artery calcifications. Hepatobiliary: No focal hepatic lesion. Prominent size liver. Calcified gallstone in the gallbladder neck. No abnormal gallbladder distention or pericholecystic inflammation. No biliary dilatation. Pancreas: No ductal dilatation or inflammation. Spleen: Normal in size without focal abnormality. Adrenals/Urinary Tract: Normal adrenal glands. No hydronephrosis or perinephric edema. Homogeneous renal enhancement with symmetric excretion on delayed phase imaging. Urinary bladder is physiologically distended without wall thickening. Stomach/Bowel: Stomach is unremarkable. No small bowel wall thickening, inflammatory change, or obstruction. Normal appendix. Moderate volume of stool throughout the colon. Colonic wall thickening or inflammatory change. Sigmoid colon is tortuous. Vascular/Lymphatic: Mild aortic atherosclerosis. No aneurysm. No enlarged lymph nodes in the abdomen or pelvis. Reproductive: Prominent periuterine vascularity and dilatation of the ovarian veins, left greater than right. Left ovarian vein measures 8 mm, right ovarian vein measures 6 mm. No suspicious adnexal mass. Unremarkable CT appearance of the uterus. Other: Small fat containing umbilical hernia with diastasis of rectus abdominis. Minimal soft tissue density in the right anterior abdominal wall, nonspecific but may be seen with injection site. No ascites or free air. Musculoskeletal: There are no acute or suspicious osseous abnormalities. IMPRESSION: 1. No acute abnormality in  the abdomen/pelvis. 2. Prominent periuterine vascularity and dilatation of the ovarian veins,  left greater than right, can be seen with pelvic congestion syndrome in the appropriate clinical setting. 3. Cholelithiasis without gallbladder inflammation. 4. Abnormal left infrahilar soft tissue density in the lung bases with streaky and ground-glass opacities in the left lower lobe. Questionable left infrahilar adenopathy and possible 15 mm pulmonary nodule adjacent to the distal esophagus. Recommend dedicated chest CT for further evaluation, preferably with IV contrast if renal function permits. Aortic Atherosclerosis (ICD10-I70.0). Electronically Signed   By: Keith Rake M.D.   On: 12/26/2018 02:11      IMPRESSION AND PLAN:  1. COVID-19 positive - Patient has been started on IV antibiotics for bilateral pneumonia - We will repeat chest x-ray in the a.m. - Sputum culture pending - Additional labs including sed rate, d-dimer, CRP pending - Airborne and contact precautions - Repeat CBC and CMP in the a.m. -Antitussives - Telemetry monitoring - Pulse oximetry with vital signs - Normal saline infusing to peripheral IV at 75 cc/h -We will treat fever with antipyretic/Tylenol  2.  Bilateral pneumonia - IV Rocephin and azithromycin -Blood cultures pending - Sputum culture pending - DuoNebs as needed for shortness of breath and wheezing/cough - O2 per nasal cannula as needed  3.  Diabetes mellitus - Moderate sliding scale insulin -Metformin continued -Hemoglobin A1c  4.  Hyponatremia - Patient received normal saline bolus in the emergency room.  We will continue normal saline at 75 cc/h to peripheral IV -Will repeat CMP in the a.m.  5.  Abnormal chest CT - We will consult pulmonary for further evaluation as recommended by radiologist for abnormal findings  DVT and PPI prophylaxis initiated   All the records are reviewed and case discussed with ED provider. The plan of care was discussed in details with the patient (and family). I answered all questions. The patient agreed  to proceed with the above mentioned plan. Further management will depend upon hospital course.   CODE STATUS: Full code  TOTAL TIME TAKING CARE OF THIS PATIENT: 45 minutes.    [email or web address] on 12/26/2018 at 5:09 AM  Pager - [phone]  After 6pm go to [email or web address] - Technical brewer [facility]  Office  [phone]  CC: Primary care physician; [employer]   Note: This dictation was prepared with Diplomatic Services operational officer dictation along with smaller Company secretary. Any transcriptional errors that result from this process are unintentional.

## 2018-12-26 NOTE — ED Notes (Signed)
Patient transported to CT 

## 2018-12-26 NOTE — ED Notes (Signed)
Attempted to call report. RN in other room.

## 2018-12-26 NOTE — Plan of Care (Signed)
  Problem: Education: Goal: Knowledge of General Education information will improve Description: Including pain rating scale, medication(s)/side effects and non-pharmacologic comfort measures Outcome: Progressing   Problem: Health Behavior/Discharge Planning: Goal: Ability to manage health-related needs will improve Outcome: Progressing   Problem: Clinical Measurements: Goal: Ability to maintain clinical measurements within normal limits will improve Outcome: Not Progressing Note: Serum sodium level remains low at only 128 (up from 125). Collected a urine sodium level and sent down to the lab a while ago. Will continue to monitor lab values. Wenda Low [facility]

## 2018-12-26 NOTE — ED Notes (Signed)
Patient is drinking contrast.  

## 2018-12-26 NOTE — Progress Notes (Addendum)
Inpatient Diabetes Program Recommendations  AACE/ADA: New Consensus Statement on Inpatient Glycemic Control (2015)  Target Ranges:  Prepandial:   less than 140 mg/dL      Peak postprandial:   less than 180 mg/dL (1-2 hours)      Critically ill patients:  140 - 180 mg/dL   Results for Tara Salinas, Tara Salinas (MRN [id number]) as of 12/26/2018 14:09  Ref. Range 12/26/2018 06:38 12/26/2018 07:49 12/26/2018 13:09  Glucose-Capillary Latest Ref Range: 70 - 99 mg/dL 284 (H) 275 (H)  8 units NOVOLOG  286 (H)    Admit COVID +/ PNA/ Hyponatremia/ Abnormal chest CT  History: DM2   Home DM Meds: Metformin 1000 mg BID   Current Orders: Novolog Moderate Correction Scale/ SSI (0-15 units) TID AC + HS (1st just started at 8am today)       Metformin 1000 mg BID      Current A1c level pending.  Plan for transfer to [facility] campus today.  May likely needs more insulin.   MD- Please consider the following in-hospital insulin adjustments:  1. Start Lantus 13 units QHS (0.15 units/kg based on weight of 91 kg)   2. Start Novolog Meal Coverage: Novolog 4 units TID with meals  (Please add the following Hold Parameters: Hold if pt eats <50% of meal, Hold if pt NPO)      --Will follow patient during hospitalization--  Wyn Quaker RN, MSN, CDE Diabetes Coordinator Inpatient Glycemic Control Team Team Pager: [phone] (8a-5p)

## 2018-12-26 NOTE — ED Notes (Signed)
Per MD, OK to draw BMP and blood gas after liter of fluids

## 2018-12-26 NOTE — ED Notes (Signed)
Report given to Laura, RN.

## 2018-12-26 NOTE — Progress Notes (Signed)
Spanish interpreter was used at this time Mar [id number], patient resting in the bed denies any pain

## 2018-12-26 NOTE — ED Notes (Signed)
Patient states that azithromycin is hurting her arm. Medicine stopped and NP notified.

## 2018-12-26 NOTE — Progress Notes (Addendum)
Just came out from the covid pt room. Nurse called ED nurse to get report but nurse was busy with another pt.

## 2018-12-27 ENCOUNTER — Encounter (HOSPITAL_COMMUNITY): Payer: Self-pay

## 2018-12-27 DIAGNOSIS — R112 Nausea with vomiting, unspecified: Secondary | ICD-10-CM | POA: Diagnosis present

## 2018-12-27 DIAGNOSIS — R197 Diarrhea, unspecified: Secondary | ICD-10-CM | POA: Diagnosis present

## 2018-12-27 DIAGNOSIS — E871 Hypo-osmolality and hyponatremia: Secondary | ICD-10-CM | POA: Diagnosis not present

## 2018-12-27 DIAGNOSIS — U071 COVID-19: Secondary | ICD-10-CM

## 2018-12-27 LAB — CREATININE, SERUM
Creatinine, Ser: 0.56 mg/dL (ref 0.44–1.00)
GFR calc Af Amer: >60 mL/min (ref >60)
GFR calc non Af Amer: >60 mL/min (ref >60)

## 2018-12-27 LAB — COMPREHENSIVE METABOLIC PANEL
ALT: 23 U/L (ref 0–44)
AST: 25 U/L (ref 15–41)
Albumin: 3.1 g/dL — ABNORMAL LOW (ref 3.5–5.0)
Alkaline Phosphatase: 84 U/L (ref 38–126)
Anion gap: 13 (ref 5–15)
BUN: 9 mg/dL (ref 8–23)
CO2: 27 mmol/L (ref 22–32)
Calcium: 8.7 mg/dL — ABNORMAL LOW (ref 8.9–10.3)
Chloride: 87 mmol/L — ABNORMAL LOW (ref 98–111)
Creatinine, Ser: 0.53 mg/dL (ref 0.44–1.00)
GFR calc Af Amer: >60 mL/min (ref >60)
GFR calc non Af Amer: >60 mL/min (ref >60)
Glucose, Bld: 208 mg/dL — ABNORMAL HIGH (ref 70–99)
Potassium: 3.8 mmol/L (ref 3.5–5.1)
Sodium: 127 mmol/L — ABNORMAL LOW (ref 135–145)
Total Bilirubin: 0.3 mg/dL (ref 0.3–1.2)
Total Protein: 7.6 g/dL (ref 6.5–8.1)

## 2018-12-27 LAB — URINE CULTURE: Special Requests: NORMAL

## 2018-12-27 LAB — D-DIMER, QUANTITATIVE: D-Dimer, Quant: 1.44 ug/mL-FEU — ABNORMAL HIGH (ref 0.00–0.50)

## 2018-12-27 LAB — CBC
HCT: 34.2 % — ABNORMAL LOW (ref 36.0–46.0)
Hemoglobin: 11.3 g/dL — ABNORMAL LOW (ref 12.0–15.0)
MCH: 27.2 pg (ref 26.0–34.0)
MCHC: 33 g/dL (ref 30.0–36.0)
MCV: 82.2 fL (ref 80.0–100.0)
Platelets: 322 10*3/uL (ref 150–400)
RBC: 4.16 MIL/uL (ref 3.87–5.11)
RDW: 13.2 % (ref 11.5–15.5)
WBC: 10 10*3/uL (ref 4.0–10.5)
nRBC: 0 % (ref 0.0–0.2)

## 2018-12-27 LAB — GLUCOSE, CAPILLARY
Glucose-Capillary: 230 mg/dL — ABNORMAL HIGH (ref 70–99)
Glucose-Capillary: 364 mg/dL — ABNORMAL HIGH (ref 70–99)
Glucose-Capillary: 355 mg/dL — ABNORMAL HIGH (ref 70–99)
Glucose-Capillary: 244 mg/dL — ABNORMAL HIGH (ref 70–99)

## 2018-12-27 LAB — ABO/RH: ABO/RH(D): O POS

## 2018-12-27 LAB — INTERLEUKIN-6, PLASMA: Interleukin-6, Plasma: 22.4 pg/mL — ABNORMAL HIGH (ref 0.0–12.2)

## 2018-12-27 LAB — C-REACTIVE PROTEIN: CRP: 6.5 mg/dL — ABNORMAL HIGH (ref <1.0)

## 2018-12-27 LAB — HIV ANTIBODY (ROUTINE TESTING W REFLEX): HIV Screen 4th Generation wRfx: NONREACTIVE

## 2018-12-27 MED ORDER — SODIUM CHLORIDE 0.9 % IV SOLN
INTRAVENOUS | Status: DC
  Administered 2018-12-27: 15:00:00 via INTRAVENOUS

## 2018-12-27 MED ORDER — ATORVASTATIN CALCIUM 40 MG PO TABS
40.0000 mg | ORAL_TABLET | Freq: Every day | ORAL | Status: DC
  Administered 2018-12-27 – 2018-12-28 (×2): 40 mg via ORAL
  Filled 2018-12-27 (×2): qty 1

## 2018-12-27 MED ORDER — ZINC SULFATE 220 (50 ZN) MG PO CAPS
220.0000 mg | ORAL_CAPSULE | Freq: Every day | ORAL | Status: DC
  Administered 2018-12-27 – 2018-12-28 (×2): 220 mg via ORAL
  Filled 2018-12-27 (×2): qty 1

## 2018-12-27 MED ORDER — ONDANSETRON HCL 4 MG PO TABS: 4.0000 mg | ORAL_TABLET | Freq: Four times a day (QID) | ORAL | Status: DC | PRN

## 2018-12-27 MED ORDER — ENOXAPARIN SODIUM 60 MG/0.6ML ~~LOC~~ SOLN
45.0000 mg | SUBCUTANEOUS | Status: DC
  Administered 2018-12-27 – 2018-12-28 (×2): 45 mg via SUBCUTANEOUS
  Filled 2018-12-27 (×2): qty 0.6

## 2018-12-27 MED ORDER — INSULIN ASPART 100 UNIT/ML ~~LOC~~ SOLN
0.0000 [IU] | Freq: Three times a day (TID) | SUBCUTANEOUS | Status: DC
  Administered 2018-12-27: 3 [IU] via SUBCUTANEOUS
  Administered 2018-12-27 (×2): 9 [IU] via SUBCUTANEOUS
  Administered 2018-12-28: 7 [IU] via SUBCUTANEOUS
  Administered 2018-12-28: 18:00:00 5 [IU] via SUBCUTANEOUS
  Administered 2018-12-28: 09:00:00 2 [IU] via SUBCUTANEOUS

## 2018-12-27 MED ORDER — ALUM & MAG HYDROXIDE-SIMETH 200-200-20 MG/5ML PO SUSP
30.0000 mL | ORAL | Status: DC | PRN
  Administered 2018-12-27: 30 mL via ORAL
  Filled 2018-12-27: qty 30

## 2018-12-27 MED ORDER — ACETAMINOPHEN 325 MG PO TABS: 650.0000 mg | ORAL_TABLET | Freq: Four times a day (QID) | ORAL | Status: DC | PRN

## 2018-12-27 MED ORDER — PANTOPRAZOLE SODIUM 40 MG PO TBEC
40.0000 mg | DELAYED_RELEASE_TABLET | Freq: Every day | ORAL | Status: DC
  Administered 2018-12-27 – 2018-12-28 (×2): 40 mg via ORAL
  Filled 2018-12-27 (×2): qty 1

## 2018-12-27 MED ORDER — LACTATED RINGERS IV SOLN
INTRAVENOUS | Status: DC
  Administered 2018-12-27: 01:00:00 via INTRAVENOUS

## 2018-12-27 MED ORDER — VITAMIN C 500 MG PO TABS
500.0000 mg | ORAL_TABLET | Freq: Every day | ORAL | Status: DC
  Administered 2018-12-27 – 2018-12-28 (×2): 500 mg via ORAL
  Filled 2018-12-27 (×2): qty 1

## 2018-12-27 MED ORDER — ONDANSETRON HCL 4 MG/2ML IJ SOLN: 4.0000 mg | Freq: Four times a day (QID) | INTRAMUSCULAR | Status: DC | PRN

## 2018-12-27 MED ORDER — INSULIN ASPART PROT & ASPART (70-30 MIX) 100 UNIT/ML ~~LOC~~ SUSP
15.0000 [IU] | Freq: Two times a day (BID) | SUBCUTANEOUS | Status: DC
  Administered 2018-12-27 – 2018-12-28 (×3): 15 [IU] via SUBCUTANEOUS
  Filled 2018-12-27: qty 10

## 2018-12-27 NOTE — Progress Notes (Signed)
Inpatient Diabetes Program Recommendations  AACE/ADA: New Consensus Statement on Inpatient Glycemic Control (2015)  Target Ranges:  Prepandial:   less than 140 mg/dL      Peak postprandial:   less than 180 mg/dL (1-2 hours)      Critically ill patients:  140 - 180 mg/dL   Results for TIFFANIE, BLASSINGAME (MRN [id number]) as of 12/27/2018 12:26  Ref. Range 12/26/2018 07:49 12/26/2018 13:09 12/26/2018 16:15 12/26/2018 22:06 12/27/2018 07:37 12/27/2018 11:52  Glucose-Capillary Latest Ref Range: 70 - 99 mg/dL 275 (H) 286 (H) 223 (H) 154 (H) 230 (H) 364 (H)     Admit COVID +/ PNA/ Hyponatremia/ Abnormal chest CT  History: DM2   Home DM Meds: Metformin 1000 mg BID   Current Orders: Novolog Moderate Correction Scale/ SSI (0-9 units) TID AC        Current A1c level 9%.  May likely needs more insulin.   MD- Please consider the following in-hospital insulin adjustments:  1. Start Lantus 13 units QHS (0.15 units/kg based on weight of 91 kg)   2. Start Novolog Meal Coverage: Novolog 4 units TID with meals  (Please add the following Hold Parameters: Hold if pt eats <50% of meal, Hold if pt NPO)    --Will follow patient during hospitalization--  Tama Headings RN, MSN, BC-ADM Inpatient Diabetes Coordinator Team Pager [phone] (8a-5p)

## 2018-12-27 NOTE — Progress Notes (Signed)
Updated patients primary contact Tara Salinas on patient plan of care and status. All questions answered at this time

## 2018-12-27 NOTE — Progress Notes (Signed)
[place] updated

## 2018-12-27 NOTE — H&P (Signed)
History and Physical    Tara Salinas JYN:[id number] DOB: 05/02/1951 DOA: 12/26/2018  [facility]  Patient coming from: [facility]   Chief Complaint:  sob  HPI: Tara Salinas is a 68 y.o. female with medical history significant of dm transferred here from [place] after presenting there with abd pain , n/v/d 6 days prior to presentation.  Reported fevers/chiills, mild sob and cough.  Pt tested for covid and found to be positive.  Feeling overall better.  afvss with nml oxygen sats there so no steroids or other treatments received except supportive care.  Found to be hyponatremic and dehydrated so given ivf.  [facility].  Review of Systems: As per HPI otherwise 10 point review of systems negative.   Past Medical History:  Diagnosis Date  . Diabetes mellitus without complication (HCC)   . GERD (gastroesophageal reflux disease)   . Hyperlipemia     Past Surgical History:  Procedure Laterality Date  . TUBAL LIGATION       reports that she has never smoked. She has never used smokeless tobacco. She reports that she does not drink alcohol or use drugs.  No Known Allergies  No family history on file. no premature CAD  Prior to Admission medications   Medication Sig Start Date End Date Taking? Authorizing Provider  atorvastatin (LIPITOR) 40 MG tablet Take 40 mg by mouth daily.    [provider]  hydrOXYzine (ATARAX/VISTARIL) 10 MG tablet Take 10 mg by mouth at bedtime as needed (to help patient fall asleep).    [provider]  metFORMIN (GLUCOPHAGE) 1000 MG tablet Take 1,000 mg by mouth 2 (two) times daily with a meal.    [provider]  pantoprazole (PROTONIX) 40 MG tablet Take 40 mg by mouth daily.    [provider]  sertraline (ZOLOFT) 50 MG tablet Take 50 mg by mouth daily.    [provider]    Physical Exam: Vitals:   12/26/18 2359  BP: (!) 145/76  Resp:  16  Temp: 98.4 F (36.9 C)  TempSrc: Oral  SpO2: 96%      Constitutional: NAD, calm, comfortable Vitals:   12/26/18 2359  BP: (!) 145/76  Resp: 16  Temp: 98.4 F (36.9 C)  TempSrc: Oral  SpO2: 96%   Eyes: PERRL, lids and conjunctivae normal ENMT: Mucous membranes are moist. Posterior pharynx clear of any exudate or lesions.Normal dentition.  Neck: normal, supple, no masses, no thyromegaly Respiratory: clear to auscultation bilaterally, no wheezing, no crackles. Normal respiratory effort. No accessory muscle use.  Cardiovascular: Regular rate and rhythm, no murmurs / rubs / gallops. No extremity edema. 2+ pedal pulses. No carotid bruits.  Abdomen: no tenderness, no masses palpated. No hepatosplenomegaly. Bowel sounds positive.  Musculoskeletal: no clubbing / cyanosis. No joint deformity upper and lower extremities. Good ROM, no contractures. Normal muscle tone.  Skin: no rashes, lesions, ulcers. No induration Neurologic: CN 2-12 grossly intact. Sensation intact, DTR normal. Strength 5/5 in all 4.  Psychiatric: Normal judgment and insight. Alert and oriented x 3. Normal mood.    Labs on Admission: I have personally reviewed following labs and imaging studies  CBC: Recent Labs  Lab 12/25/18 2158 12/26/18 1227  WBC 11.6* 9.4  HGB 11.6* 11.5*  HCT 34.3* 34.6*  MCV 80.0 81.2  PLT 306 312   Basic Metabolic Panel: Recent Labs  Lab 12/25/18 2158 12/26/18 0221 12/26/18 1227 12/26/18 1321  NA 123*  125* 128*  --   K 3.4* 3.4* 3.2*  --   CL 78* 80* 90*  --   CO2 26 29 28   --   GLUCOSE 347* 314* 253*  --   BUN 15 13 10   --   CREATININE 0.57 0.54 0.57  --   CALCIUM 8.3* 7.9* 8.3*  --   MG  --   --   --  1.7   GFR: CrCl cannot be calculated (Unknown ideal weight.). Liver Function Tests: Recent Labs  Lab 12/25/18 2158  AST 24  ALT 25  ALKPHOS 94  BILITOT 0.4  PROT 8.0  ALBUMIN 3.4*   Recent Labs  Lab 12/25/18 2158  LIPASE 27   No results for input(s):  AMMONIA in the last 168 hours. Coagulation Profile: No results for input(s): INR, PROTIME in the last 168 hours. Cardiac Enzymes: No results for input(s): CKTOTAL, CKMB, CKMBINDEX, TROPONINI in the last 168 hours. BNP (last 3 results) No results for input(s): PROBNP in the last 8760 hours. HbA1C: Recent Labs    12/26/18 1227  HGBA1C 9.0*   CBG: Recent Labs  Lab 12/26/18 0638 12/26/18 0749 12/26/18 1309 12/26/18 1615 12/26/18 2206  GLUCAP 284* 275* 286* 223* 154*   Lipid Profile: Recent Labs    12/26/18 1227  TRIG 75   Thyroid Function Tests: No results for input(s): TSH, T4TOTAL, FREET4, T3FREE, THYROIDAB in the last 72 hours. Anemia Panel: Recent Labs    12/26/18 1321  FERRITIN 43   Urine analysis:    Component Value Date/Time   COLORURINE YELLOW (A) 12/26/2018 0220   APPEARANCEUR HAZY (A) 12/26/2018 0220   APPEARANCEUR Hazy 08/11/2011 1947   LABSPEC 1.018 12/26/2018 0220   LABSPEC 1.027 08/11/2011 1947   PHURINE 6.0 12/26/2018 0220   GLUCOSEU >=500 (A) 12/26/2018 0220   GLUCOSEU >=500 08/11/2011 1947   HGBUR NEGATIVE 12/26/2018 0220   BILIRUBINUR NEGATIVE 12/26/2018 0220   BILIRUBINUR Negative 08/11/2011 1947   KETONESUR NEGATIVE 12/26/2018 0220   PROTEINUR 30 (A) 12/26/2018 0220   NITRITE NEGATIVE 12/26/2018 0220   LEUKOCYTESUR TRACE (A) 12/26/2018 0220   LEUKOCYTESUR Negative 08/11/2011 1947   Sepsis Labs: !!!!!!!!!!!!!!!!!!!!!!!!!!!!!!!!!!!!!!!!!!!! @LABRCNTIP (procalcitonin:4,lacticidven:4) ) Recent Results (from the past 240 hour(s))  SARS Coronavirus 2 [facility](Hospital order, Performed in [facility] lab) Nasopharyngeal     Status: Abnormal   Collection Time: 12/26/18  2:21 AM   Specimen: Nasopharyngeal  Result Value Ref Range Status   SARS Coronavirus 2 POSITIVE (A) NEGATIVE Final    Comment: RESULT CALLED TO, READ BACK BY AND VERIFIED WITH: LEIGH FERGUSON ON 12/26/18 AT 0411 [facility] (NOTE) If result is NEGATIVE SARS-CoV-2 target nucleic  acids are NOT DETECTED. The SARS-CoV-2 RNA is generally detectable in upper and lower  respiratory specimens during the acute phase of infection. The lowest  concentration of SARS-CoV-2 viral copies this assay can detect is 250  copies / mL. A negative result does not preclude SARS-CoV-2 infection  and should not be used as the sole basis for treatment or other  patient management decisions.  A negative result may occur with  improper specimen collection / handling, submission of specimen other  than nasopharyngeal swab, presence of viral mutation(s) within the  areas targeted by this assay, and inadequate number of viral copies  (<250 copies / mL). A negative result must be combined with clinical  observations, patient history, and epidemiological information. If result is POSITIVE SARS-CoV-2 target nucleic acids are DETECTED.  The SARS-CoV-2 RNA is generally detectable in  upper and lower  respiratory specimens during the acute phase of infection.  Positive  results are indicative of active infection with SARS-CoV-2.  Clinical  correlation with patient history and other diagnostic information is  necessary to determine patient infection status.  Positive results do  not rule out bacterial infection or co-infection with other viruses. If result is PRESUMPTIVE POSTIVE SARS-CoV-2 nucleic acids MAY BE PRESENT.   A presumptive positive result was obtained on the submitted specimen  and confirmed on repeat testing.  While 2019 novel coronavirus  (SARS-CoV-2) nucleic acids may be present in the submitted sample  additional confirmatory testing may be necessary for epidemiological  and / or clinical management purposes  to differentiate between  SARS-CoV-2 and other Sarbecovirus currently known to infect humans.  If clinically indicated additional testing with an alternate test  methodology [phone]  is advised. The SARS-CoV-2 RNA is generally  detectable in upper and lower respiratory  specimens during the acute  phase of infection. The expected result is Negative. Fact Sheet for Patients:  BoilerBrush.com.cyhttps://www.fda.gov/media/136312/download Fact Sheet for Healthcare Providers: https://pope.com/https://www.fda.gov/media/136313/download This test is not yet approved or cleared by the [place] FDA and has been authorized for detection and/or diagnosis of SARS-CoV-2 by FDA under an Emergency Use Authorization (EUA).  This EUA will remain in effect (meaning this test can be used) for the duration of the COVID-19 declaration under Section 564(b)(1) of the Act, 21 U.S.C. section 360bbb-3(b)(1), unless the authorization is terminated or revoked sooner. Performed at [facility], [address] [place], [place] [address]   CULTURE, BLOOD (ROUTINE X 2) w Reflex to ID Panel     Status: None (Preliminary result)   Collection Time: 12/26/18  5:00 AM   Specimen: BLOOD  Result Value Ref Range Status   Specimen Description BLOOD RIGHT ANTECUBITAL  Final   Special Requests   Final    BOTTLES DRAWN AEROBIC AND ANAEROBIC Blood Culture adequate volume   Culture   Final    NO GROWTH <12 HOURS Performed at [facility], [address] [place], [place] [address]    Report Status PENDING  Incomplete  CULTURE, BLOOD (ROUTINE X 2) w Reflex to ID Panel     Status: None (Preliminary result)   Collection Time: 12/26/18  5:30 AM   Specimen: BLOOD  Result Value Ref Range Status   Specimen Description BLOOD RIGHT HAND  Final   Special Requests   Final    BOTTLES DRAWN AEROBIC AND ANAEROBIC Blood Culture adequate volume   Culture   Final    NO GROWTH <12 HOURS Performed at [facility], [address] [place], [place] [address]    Report Status PENDING  Incomplete     Radiological Exams on Admission: Ct Chest W Contrast  Result Date: 12/26/2018 CLINICAL DATA:  Abnormal lung bases on abdominal CT. EXAM: CT CHEST WITH CONTRAST TECHNIQUE: Multidetector CT imaging of the chest  was performed during intravenous contrast administration. CONTRAST:  75mL OMNIPAQUE IOHEXOL 300 MG/ML  SOLN COMPARISON:  Abdominal CT earlier this day. FINDINGS: Cardiovascular: Aortic atherosclerosis. No aneurysm. No evidence of dissection. Borderline cardiomegaly. Coronary artery calcifications. Minimal fluid in the pericardial recess without significant pericardial effusion. Mediastinum/Nodes: Mild multifocal mediastinal and left hilar adenopathy. Largest lymph node in the subcarinal station measuring 19 mm short axis. Lower paratracheal node measures 13 mm short axis. Upper paratracheal nodes are prominent but subcentimeter. Right supraclavicular node measures 12 mm. There is no axillary adenopathy. Left hilar nodes are not  well-defined, however measures approximately 11 mm. There is also ill-defined soft tissue density at the left hilum. Esophagus is decompressed. No visualized thyroid nodule. Lungs/Pleura: Anteromedial left lower lobe nodule measuring 13 mm, series 3 image 103, adjacent to the distal esophagus. Ill-defined left perihilar soft tissue density without dominant mass, however there is partial obstruction of segmental bronchus in the left lower lobe, series 3, image 77. Associated linear patchy opacities in the more distal segment may be atelectasis, but is nonspecific. Subpleural left upper lobe nodule measuring 6 mm, image 49 series 3. The right lung is clear. No pulmonary edema or pleural fluid. Trachea and mainstem bronchi are patent. Upper Abdomen: Assessed on abdominal CT earlier this day. Musculoskeletal: Degenerative change in the spine. Posterior disc osteophyte complex at T6-T7 causes mass effect and narrowing of the spinal canal. There are no acute or suspicious osseous abnormalities. IMPRESSION: 1. Mild mediastinal, left hilar and right supraclavicular adenopathy. Largest lymph node is subcarinal measuring 19 mm short axis. 2. Additional ill-defined soft tissue density in the left hilum  causes partial obstruction of segmental lower lobe bronchus. Distal opacities in the left lower lobe favor atelectasis, but are nonspecific. 3. Anteromedial left lower lobe pulmonary nodule measuring 13 mm. Single additional subpleural left upper lobe 6 mm pulmonary nodule. 4. Recommend lymphoproliferative workup and specialist consultation. PET CT may be of value to assess pulmonary nodule as well as evaluate for left hilar metabolic activity. 5. Aortic atherosclerosis and coronary artery calcifications. Electronically Signed   By: Narda Rutherford M.D.   On: 12/26/2018 03:31   Ct Abdomen Pelvis W Contrast  Result Date: 12/26/2018 CLINICAL DATA:  Acute abdominal pain. EXAM: CT ABDOMEN AND PELVIS WITH CONTRAST TECHNIQUE: Multidetector CT imaging of the abdomen and pelvis was performed using the standard protocol following bolus administration of intravenous contrast. CONTRAST:  OMNIPAQUE IOHEXOL 300 MG/ML  SOLN COMPARISON:  None. FINDINGS: Lower chest: Partially included increased soft tissue density in the left infrahilar region with probable enlarged lymph node,, image 2 series 2. Associated streaky, linear, and ground-glass opacities in the left lower lobe. Questionable 15 mm pulmonary nodule in the anteromedial left lower lobe, image 13 series 4. This abuts the esophagus at the diaphragmatic hiatus. Coronary artery calcifications. Hepatobiliary: No focal hepatic lesion. Prominent size liver. Calcified gallstone in the gallbladder neck. No abnormal gallbladder distention or pericholecystic inflammation. No biliary dilatation. Pancreas: No ductal dilatation or inflammation. Spleen: Normal in size without focal abnormality. Adrenals/Urinary Tract: Normal adrenal glands. No hydronephrosis or perinephric edema. Homogeneous renal enhancement with symmetric excretion on delayed phase imaging. Urinary bladder is physiologically distended without wall thickening. Stomach/Bowel: Stomach is unremarkable. No small  bowel wall thickening, inflammatory change, or obstruction. Normal appendix. Moderate volume of stool throughout the colon. Colonic wall thickening or inflammatory change. Sigmoid colon is tortuous. Vascular/Lymphatic: Mild aortic atherosclerosis. No aneurysm. No enlarged lymph nodes in the abdomen or pelvis. Reproductive: Prominent periuterine vascularity and dilatation of the ovarian veins, left greater than right. Left ovarian vein measures 8 mm, right ovarian vein measures 6 mm. No suspicious adnexal mass. Unremarkable CT appearance of the uterus. Other: Small fat containing umbilical hernia with diastasis of rectus abdominis. Minimal soft tissue density in the right anterior abdominal wall, nonspecific but may be seen with injection site. No ascites or free air. Musculoskeletal: There are no acute or suspicious osseous abnormalities. IMPRESSION: 1. No acute abnormality in the abdomen/pelvis. 2. Prominent periuterine vascularity and dilatation of the ovarian veins, left greater than right, can  be seen with pelvic congestion syndrome in the appropriate clinical setting. 3. Cholelithiasis without gallbladder inflammation. 4. Abnormal left infrahilar soft tissue density in the lung bases with streaky and ground-glass opacities in the left lower lobe. Questionable left infrahilar adenopathy and possible 15 mm pulmonary nodule adjacent to the distal esophagus. Recommend dedicated chest CT for further evaluation, preferably with IV contrast if renal function permits. Aortic Atherosclerosis (ICD10-I70.0). Electronically Signed   By: Keith Rake M.D.   On: 12/26/2018 02:11   Old chart reviewed  Assessment/Plan 68 yo female with covid positive status and mainly GI symptoms  Principal Problem:   Hyponatremia- from not tolerating po.  Improving to 128 with ivf.   Cont overnight and repaet in am.  Likely can d/c tom  Active Problems:   Abdominal pain- mostly resolved   Nausea, vomiting and diarrhea- mostly  resolved   COVID-19 virus infection- vit c/zinc/asp/low dose lovenox  DM - ssi  Med list pending pharm review obs on medical bed   DVT prophylaxis: lovenox  Code Status: full Family Communication: none Disposition Plan:  In am if vitals remain normal and tolerating po Consults called: none Admission status:  observation   Charley Miske A MD [facility]  If 7PM-7AM, please contact night-coverage [email or web address] Password [facility]  12/27/2018, 12:13 AM

## 2018-12-27 NOTE — Plan of Care (Signed)
Pt had uneventful day. VSS. UOP adequate. Appetite good. Pt remains on RA. No c/o pain. IVF infusing. Insulin requirements increased. Pt remained free from injury. Call bell within reach. Encouraged to call for assistance. Will continue plan of care  Problem: Education: Goal: Knowledge of risk factors and measures for prevention of condition will improve Outcome: Progressing

## 2018-12-27 NOTE — Progress Notes (Signed)
PROGRESS NOTE                                                                                                                                                                                                             Patient Demographics:    Tara Salinas, is a 68 y.o. female, DOB - 08/04/1950, ZOX:[id number]  Admit date - 12/26/2018   Admitting Physician Leroy Sea, MD  Outpatient Primary MD for the patient is [facility]  LOS - 1   No chief complaint on file.      Brief Narrative    This is a no charge note as patient admitted earlier today by Dr. Ollen Bowl Dayane Hillenburg is a 69 y.o. female with medical history significant of dm transferred here from [place] after presenting there with abd pain , n/v/d 6 days prior to presentation.  Reported fevers/chiills, mild sob and cough.  Pt tested for covid and found to be positive.  Feeling overall better.  afvss with nml oxygen sats there so no steroids or other treatments received except supportive care.  Found to be hyponatremic and dehydrated so given ivf.  Referred here to [facility] for covid positive status.    Subjective:    Tara Salinas today still reports  has, No headache, No chest pain, No abdominal pain - No Nausea, No new weakness tingling or numbness, No Cough - SOB.   Assessment  & Plan :    Principal Problem:   Hyponatremia Active Problems:   COVID-19   Abdominal pain   Nausea, vomiting and diarrhea   COVID-19 virus infection  COVID-19 infection -Her current presentation mainly GI, nausea, vomiting and diarrhea, she still appears to be dehydrated, remains with significant nausea this morning, she remains with significant diarrhea, continue with IV fluids, as needed nausea medication. -She is not hypoxic, no indication for Remdesivir or steroids. -Continue with zinc and vitamin C   COVID-19 Labs  Recent Labs    12/26/18 1321  12/27/18 0240  DDIMER  --  1.44*  FERRITIN 43  --   LDH 153  --   CRP 8.0* 6.5*    Lab Results  Component Value Date   SARSCOV2NAA POSITIVE (A) 12/26/2018   Abnormal finding on CT chest with left perihilar lymphadenopathy/small pulmonary nodule -Patient will need outpatient follow-up  for lymphoproliferative work-up , need referral to hematology service as an outpatient, likely will need PET-CT per radiology recommendation.  Hyponatremia -Due to volume depletion, continue with IV normal saline,  Hyperlipidemia -Continue with statin  Diabetes mellitus -Continue to hold metformin, CBGs are uncontrolled on sliding scale, resume insulin 70/30, at 15 units twice daily       Code Status : Full  Family Communication  : D/W patient via telemetry interpreter  Disposition Plan  : Home  Barriers For Discharge : She remains hyponatremic, actually her sodium trending down, on IV fluids, remains dehydrated  Consults  : None  Procedures  : None  DVT Prophylaxis  :    Lab Results  Component Value Date   PLT 322 12/27/2018    Antibiotics  :    Anti-infectives (From admission, onward)   None        Objective:   Vitals:   12/26/18 2359 12/27/18 0005 12/27/18 0514 12/27/18 0739  BP: (!) 145/76  (!) 150/69 (!) 152/62  Pulse:   89 95  Resp: 16  17   Temp: 98.4 F (36.9 C)  98.5 F (36.9 C) 99 F (37.2 C)  TempSrc: Oral  Oral Oral  SpO2: 96%  93% 97%  Weight:  91 kg    Height:  5' (1.524 m)      Wt Readings from Last 3 Encounters:  12/27/18 91 kg  12/26/18 91.4 kg  02/13/18 81.6 kg     Intake/Output Summary (Last 24 hours) at 12/27/2018 1334 Last data filed at 12/27/2018 0930 Gross per 24 hour  Intake 926.67 ml  Output 4 ml  Net 922.67 ml     Physical Exam  Awake Alert, Oriented X 3, No new F.N deficits, Normal affect Symmetrical Chest wall movement, Good air movement bilaterally, CTAB RRR,No Gallops,Rubs or new Murmurs, No Parasternal Heave +ve  B.Sounds, mild epigastic tenderness,  No rebound - guarding or rigidity. No Cyanosis, Clubbing or edema, No new Rash or bruise      Data Review:    CBC Recent Labs  Lab 12/25/18 2158 12/26/18 1227 12/27/18 0240  WBC 11.6* 9.4 10.0  HGB 11.6* 11.5* 11.3*  HCT 34.3* 34.6* 34.2*  PLT 306 312 322  MCV 80.0 81.2 82.2  MCH 27.0 27.0 27.2  MCHC 33.8 33.2 33.0  RDW 13.2 13.2 13.2    Chemistries  Recent Labs  Lab 12/25/18 2158 12/26/18 0221 12/26/18 1227 12/26/18 1321 12/27/18 0240  NA 123* 125* 128*  --  127*  K 3.4* 3.4* 3.2*  --  3.8  CL 78* 80* 90*  --  87*  CO2 26 29 28   --  27  GLUCOSE 347* 314* 253*  --  208*  BUN 15 13 10   --  9  CREATININE 0.57 0.54 0.57  --  0.53   0.56  CALCIUM 8.3* 7.9* 8.3*  --  8.7*  MG  --   --   --  1.7  --   AST 24  --   --   --  25  ALT 25  --   --   --  23  ALKPHOS 94  --   --   --  84  BILITOT 0.4  --   --   --  0.3   ------------------------------------------------------------------------------------------------------------------ Recent Labs    12/26/18 1227  TRIG 75    Lab Results  Component Value Date   HGBA1C 9.0 (H) 12/26/2018   ------------------------------------------------------------------------------------------------------------------ No results for input(s): TSH,  T4TOTAL, T3FREE, THYROIDAB in the last 72 hours.  Invalid input(s): FREET3 ------------------------------------------------------------------------------------------------------------------ Recent Labs    12/26/18 1321  FERRITIN 43    Coagulation profile No results for input(s): INR, PROTIME in the last 168 hours.  Recent Labs    12/27/18 0240  DDIMER 1.44*    Cardiac Enzymes No results for input(s): CKMB, TROPONINI, MYOGLOBIN in the last 168 hours.  Invalid input(s): CK ------------------------------------------------------------------------------------------------------------------ No results found for: BNP  Inpatient  Medications  Scheduled Meds:  enoxaparin (LOVENOX) injection  45 mg Subcutaneous Q24H   insulin aspart  0-9 Units Subcutaneous TID WC   vitamin C  500 mg Oral Daily   zinc sulfate  220 mg Oral Daily   Continuous Infusions:  lactated ringers 50 mL/hr at 12/27/18 1046   PRN Meds:.acetaminophen, alum & mag hydroxide-simeth, ondansetron **OR** ondansetron (ZOFRAN) IV  Micro Results Recent Results (from the past 240 hour(s))  Urine Culture     Status: Abnormal   Collection Time: 12/26/18  2:20 AM   Specimen: Urine, Random  Result Value Ref Range Status   Specimen Description   Final    URINE, RANDOM Performed at [facility], [address], [place], [place] [address]    Special Requests   Final    Normal Performed at [facility], [address] [place], [place] [address]    Culture MULTIPLE SPECIES PRESENT, SUGGEST RECOLLECTION (A)  Final   Report Status 12/27/2018 FINAL  Final  SARS Coronavirus 2 [facility] order, Performed in [place] hospital lab) Nasopharyngeal     Status: Abnormal   Collection Time: 12/26/18  2:21 AM   Specimen: Nasopharyngeal  Result Value Ref Range Status   SARS Coronavirus 2 POSITIVE (A) NEGATIVE Final    Comment: RESULT CALLED TO, READ BACK BY AND VERIFIED WITH: LEIGH FERGUSON ON 12/26/18 AT 0411 [facility] (NOTE) If result is NEGATIVE SARS-CoV-2 target nucleic acids are NOT DETECTED. The SARS-CoV-2 RNA is generally detectable in upper and lower  respiratory specimens during the acute phase of infection. The lowest  concentration of SARS-CoV-2 viral copies this assay can detect is 250  copies / mL. A negative result does not preclude SARS-CoV-2 infection  and should not be used as the sole basis for treatment or other  patient management decisions.  A negative result may occur with  improper specimen collection / handling, submission of specimen other  than nasopharyngeal swab, presence of viral mutation(s) within the  areas  targeted by this assay, and inadequate number of viral copies  (<250 copies / mL). A negative result must be combined with clinical  observations, patient history, and epidemiological information. If result is POSITIVE SARS-CoV-2 target nucleic acids are DETECTED.  The SARS-CoV-2 RNA is generally detectable in upper and lower  respiratory specimens during the acute phase of infection.  Positive  results are indicative of active infection with SARS-CoV-2.  Clinical  correlation with patient history and other diagnostic information is  necessary to determine patient infection status.  Positive results do  not rule out bacterial infection or co-infection with other viruses. If result is PRESUMPTIVE POSTIVE SARS-CoV-2 nucleic acids MAY BE PRESENT.   A presumptive positive result was obtained on the submitted specimen  and confirmed on repeat testing.  While 2019 novel coronavirus  (SARS-CoV-2) nucleic acids may be present in the submitted sample  additional confirmatory testing may be necessary for epidemiological  and / or clinical management purposes  to differentiate between  SARS-CoV-2 and other Sarbecovirus currently known  to infect humans.  If clinically indicated additional testing with an alternate test  methodology [phone]  is advised. The SARS-CoV-2 RNA is generally  detectable in upper and lower respiratory specimens during the acute  phase of infection. The expected result is Negative. Fact Sheet for Patients:  BoilerBrush.com.cyhttps://www.fda.gov/media/136312/download Fact Sheet for Healthcare Providers: https://pope.com/https://www.fda.gov/media/136313/download This test is not yet approved or cleared by the [place] FDA and has been authorized for detection and/or diagnosis of SARS-CoV-2 by FDA under an Emergency Use Authorization (EUA).  This EUA will remain in effect (meaning this test can be used) for the duration of the COVID-19 declaration under Section 564(b)(1) of the Act, 21 U.S.C. section  360bbb-3(b)(1), unless the authorization is terminated or revoked sooner. Performed at [facility], [address] [place], [place] [address]   CULTURE, BLOOD (ROUTINE X 2) w Reflex to ID Panel     Status: None (Preliminary result)   Collection Time: 12/26/18  5:00 AM   Specimen: BLOOD  Result Value Ref Range Status   Specimen Description BLOOD RIGHT ANTECUBITAL  Final   Special Requests   Final    BOTTLES DRAWN AEROBIC AND ANAEROBIC Blood Culture adequate volume   Culture   Final    NO GROWTH < 24 HOURS Performed at [facility], [address] [place], [place] [address]    Report Status PENDING  Incomplete  CULTURE, BLOOD (ROUTINE X 2) w Reflex to ID Panel     Status: None (Preliminary result)   Collection Time: 12/26/18  5:30 AM   Specimen: BLOOD  Result Value Ref Range Status   Specimen Description BLOOD RIGHT HAND  Final   Special Requests   Final    BOTTLES DRAWN AEROBIC AND ANAEROBIC Blood Culture adequate volume   Culture   Final    NO GROWTH < 24 HOURS Performed at [facility], [address] [place], [place] [address]    Report Status PENDING  Incomplete    Radiology Reports Ct Chest W Contrast  Result Date: 12/26/2018 CLINICAL DATA:  Abnormal lung bases on abdominal CT. EXAM: CT CHEST WITH CONTRAST TECHNIQUE: Multidetector CT imaging of the chest was performed during intravenous contrast administration. CONTRAST:  75mL OMNIPAQUE IOHEXOL 300 MG/ML  SOLN COMPARISON:  Abdominal CT earlier this day. FINDINGS: Cardiovascular: Aortic atherosclerosis. No aneurysm. No evidence of dissection. Borderline cardiomegaly. Coronary artery calcifications. Minimal fluid in the pericardial recess without significant pericardial effusion. Mediastinum/Nodes: Mild multifocal mediastinal and left hilar adenopathy. Largest lymph node in the subcarinal station measuring 19 mm short axis. Lower paratracheal node measures 13 mm short axis. Upper paratracheal  nodes are prominent but subcentimeter. Right supraclavicular node measures 12 mm. There is no axillary adenopathy. Left hilar nodes are not well-defined, however measures approximately 11 mm. There is also ill-defined soft tissue density at the left hilum. Esophagus is decompressed. No visualized thyroid nodule. Lungs/Pleura: Anteromedial left lower lobe nodule measuring 13 mm, series 3 image 103, adjacent to the distal esophagus. Ill-defined left perihilar soft tissue density without dominant mass, however there is partial obstruction of segmental bronchus in the left lower lobe, series 3, image 77. Associated linear patchy opacities in the more distal segment may be atelectasis, but is nonspecific. Subpleural left upper lobe nodule measuring 6 mm, image 49 series 3. The right lung is clear. No pulmonary edema or pleural fluid. Trachea and mainstem bronchi are patent. Upper Abdomen: Assessed on abdominal CT earlier this day. Musculoskeletal: Degenerative change in the spine. Posterior disc osteophyte  complex at T6-T7 causes mass effect and narrowing of the spinal canal. There are no acute or suspicious osseous abnormalities. IMPRESSION: 1. Mild mediastinal, left hilar and right supraclavicular adenopathy. Largest lymph node is subcarinal measuring 19 mm short axis. 2. Additional ill-defined soft tissue density in the left hilum causes partial obstruction of segmental lower lobe bronchus. Distal opacities in the left lower lobe favor atelectasis, but are nonspecific. 3. Anteromedial left lower lobe pulmonary nodule measuring 13 mm. Single additional subpleural left upper lobe 6 mm pulmonary nodule. 4. Recommend lymphoproliferative workup and specialist consultation. PET CT may be of value to assess pulmonary nodule as well as evaluate for left hilar metabolic activity. 5. Aortic atherosclerosis and coronary artery calcifications. Electronically Signed   By: Narda RutherfordMelanie  Sanford M.D.   On: 12/26/2018 03:31   Ct Abdomen  Pelvis W Contrast  Result Date: 12/26/2018 CLINICAL DATA:  Acute abdominal pain. EXAM: CT ABDOMEN AND PELVIS WITH CONTRAST TECHNIQUE: Multidetector CT imaging of the abdomen and pelvis was performed using the standard protocol following bolus administration of intravenous contrast. CONTRAST:  100mL OMNIPAQUE IOHEXOL 300 MG/ML  SOLN COMPARISON:  None. FINDINGS: Lower chest: Partially included increased soft tissue density in the left infrahilar region with probable enlarged lymph node,, image 2 series 2. Associated streaky, linear, and ground-glass opacities in the left lower lobe. Questionable 15 mm pulmonary nodule in the anteromedial left lower lobe, image 13 series 4. This abuts the esophagus at the diaphragmatic hiatus. Coronary artery calcifications. Hepatobiliary: No focal hepatic lesion. Prominent size liver. Calcified gallstone in the gallbladder neck. No abnormal gallbladder distention or pericholecystic inflammation. No biliary dilatation. Pancreas: No ductal dilatation or inflammation. Spleen: Normal in size without focal abnormality. Adrenals/Urinary Tract: Normal adrenal glands. No hydronephrosis or perinephric edema. Homogeneous renal enhancement with symmetric excretion on delayed phase imaging. Urinary bladder is physiologically distended without wall thickening. Stomach/Bowel: Stomach is unremarkable. No small bowel wall thickening, inflammatory change, or obstruction. Normal appendix. Moderate volume of stool throughout the colon. Colonic wall thickening or inflammatory change. Sigmoid colon is tortuous. Vascular/Lymphatic: Mild aortic atherosclerosis. No aneurysm. No enlarged lymph nodes in the abdomen or pelvis. Reproductive: Prominent periuterine vascularity and dilatation of the ovarian veins, left greater than right. Left ovarian vein measures 8 mm, right ovarian vein measures 6 mm. No suspicious adnexal mass. Unremarkable CT appearance of the uterus. Other: Small fat containing umbilical  hernia with diastasis of rectus abdominis. Minimal soft tissue density in the right anterior abdominal wall, nonspecific but may be seen with injection site. No ascites or free air. Musculoskeletal: There are no acute or suspicious osseous abnormalities. IMPRESSION: 1. No acute abnormality in the abdomen/pelvis. 2. Prominent periuterine vascularity and dilatation of the ovarian veins, left greater than right, can be seen with pelvic congestion syndrome in the appropriate clinical setting. 3. Cholelithiasis without gallbladder inflammation. 4. Abnormal left infrahilar soft tissue density in the lung bases with streaky and ground-glass opacities in the left lower lobe. Questionable left infrahilar adenopathy and possible 15 mm pulmonary nodule adjacent to the distal esophagus. Recommend dedicated chest CT for further evaluation, preferably with IV contrast if renal function permits. Aortic Atherosclerosis (ICD10-I70.0). Electronically Signed   By: Narda RutherfordMelanie  Sanford M.D.   On: 12/26/2018 02:11    Time Spent in minutes  No charge   Huey Bienenstockawood Jaiel Saraceno M.D on 12/27/2018 at 1:35 PM  Between 7am to 7pm - Pager - [phone]  After 7pm go to [email or web address] - password [email or web address]  [facility] -  Office  [phone]

## 2018-12-28 DIAGNOSIS — R197 Diarrhea, unspecified: Secondary | ICD-10-CM

## 2018-12-28 DIAGNOSIS — R112 Nausea with vomiting, unspecified: Secondary | ICD-10-CM

## 2018-12-28 DIAGNOSIS — E871 Hypo-osmolality and hyponatremia: Secondary | ICD-10-CM

## 2018-12-28 DIAGNOSIS — U071 COVID-19: Secondary | ICD-10-CM | POA: Diagnosis not present

## 2018-12-28 LAB — COMPREHENSIVE METABOLIC PANEL
ALT: 23 U/L (ref 0–44)
AST: 20 U/L (ref 15–41)
Albumin: 3.1 g/dL — ABNORMAL LOW (ref 3.5–5.0)
Alkaline Phosphatase: 82 U/L (ref 38–126)
Anion gap: 11 (ref 5–15)
BUN: 10 mg/dL (ref 8–23)
CO2: 29 mmol/L (ref 22–32)
Calcium: 9.1 mg/dL (ref 8.9–10.3)
Chloride: 92 mmol/L — ABNORMAL LOW (ref 98–111)
Creatinine, Ser: 0.54 mg/dL (ref 0.44–1.00)
GFR calc Af Amer: >60 mL/min (ref >60)
GFR calc non Af Amer: >60 mL/min (ref >60)
Glucose, Bld: 185 mg/dL — ABNORMAL HIGH (ref 70–99)
Potassium: 3.4 mmol/L — ABNORMAL LOW (ref 3.5–5.1)
Sodium: 132 mmol/L — ABNORMAL LOW (ref 135–145)
Total Bilirubin: 0.4 mg/dL (ref 0.3–1.2)
Total Protein: 7.5 g/dL (ref 6.5–8.1)

## 2018-12-28 LAB — CBC WITH DIFFERENTIAL/PLATELET
Abs Immature Granulocytes: 0.03 10*3/uL (ref 0.00–0.07)
Basophils Absolute: 0.1 10*3/uL (ref 0.0–0.1)
Basophils Relative: 2 %
Eosinophils Absolute: 0.1 10*3/uL (ref 0.0–0.5)
Eosinophils Relative: 1 %
HCT: 33.5 % — ABNORMAL LOW (ref 36.0–46.0)
Hemoglobin: 11.2 g/dL — ABNORMAL LOW (ref 12.0–15.0)
Immature Granulocytes: 1 %
Lymphocytes Relative: 38 %
Lymphs Abs: 2 10*3/uL (ref 0.7–4.0)
MCH: 27.3 pg (ref 26.0–34.0)
MCHC: 33.4 g/dL (ref 30.0–36.0)
MCV: 81.7 fL (ref 80.0–100.0)
Monocytes Absolute: 0.7 10*3/uL (ref 0.1–1.0)
Monocytes Relative: 12 %
Neutro Abs: 2.5 10*3/uL (ref 1.7–7.7)
Neutrophils Relative %: 46 %
Platelets: 348 10*3/uL (ref 150–400)
RBC: 4.1 MIL/uL (ref 3.87–5.11)
RDW: 13.2 % (ref 11.5–15.5)
WBC: 5.3 10*3/uL (ref 4.0–10.5)
nRBC: 0 % (ref 0.0–0.2)

## 2018-12-28 LAB — D-DIMER, QUANTITATIVE: D-Dimer, Quant: 1.44 ug/mL-FEU — ABNORMAL HIGH (ref 0.00–0.50)

## 2018-12-28 LAB — GLUCOSE, CAPILLARY
Glucose-Capillary: 200 mg/dL — ABNORMAL HIGH (ref 70–99)
Glucose-Capillary: 304 mg/dL — ABNORMAL HIGH (ref 70–99)
Glucose-Capillary: 259 mg/dL — ABNORMAL HIGH (ref 70–99)

## 2018-12-28 LAB — C-REACTIVE PROTEIN: CRP: 5.5 mg/dL — ABNORMAL HIGH (ref <1.0)

## 2018-12-28 MED ORDER — DEXAMETHASONE 6 MG PO TABS: 6.0000 mg | ORAL_TABLET | Freq: Two times a day (BID) | ORAL | 0 refills | Status: DC

## 2018-12-28 NOTE — Discharge Summary (Addendum)
Tara Salinas, is a 68 y.o. female  DOB October 19, 1950  MRN [id number].  Admission date:  12/26/2018  Admitting Physician  Leroy Sea, MD  Discharge Date:  12/28/2018   Primary MD  Inc, [facility]  Recommendations for primary care physician for things to follow:  -Patient will need outpatient follow-up for lymphoproliferative work-up , -Please check CBC, BMP during next visit   Admission Diagnosis  COVID 19   Discharge Diagnosis  COVID 19    Principal Problem:   Hyponatremia Active Problems:   COVID-19   Abdominal pain   Nausea, vomiting and diarrhea   COVID-19 virus infection      Past Medical History:  Diagnosis Date   Diabetes mellitus without complication (HCC)    GERD (gastroesophageal reflux disease)    Hyperlipemia     Past Surgical History:  Procedure Laterality Date   TUBAL LIGATION         History of present illness and  Hospital Course:     Kindly see H&P for history of present illness and admission details, please review complete Labs, Consult reports and Test reports for all details in brief  HPI  from the history and physical done on the day of admission 12/27/2018  HPI: Tara Salinas is a 68 y.o. female with medical history significant of dm transferred here from [place] after presenting there with abd pain , n/v/d 6 days prior to presentation.  Reported fevers/chiills, mild sob and cough.  Pt tested for covid and found to be positive.  Feeling overall better.  afvss with nml oxygen sats there so no steroids or other treatments received except supportive care.  Found to be hyponatremic and dehydrated so given ivf.  Referred here to [facility] for covid positive status.  Hospital Course    COVID-19 infection -Her current presentation mainly GI, nausea, vomiting and diarrhea, she was with dehydration on admission, she received IV fluids,  her nausea and vomiting has significantly improved, tolerating oral intake, no further abdominal pain, no further diarrhea as well. -I have explained at length for the patient tele interpreter and her son via phone, if she has any respiratory symptoms or shortness of breath, to come to ED for further evaluation especially it may be too early for her to manifest any respiratory symptoms. -She will be discharged on Decadron 6 mg twice daily. -She is not hypoxic, no indication for Remdesivir or steroids. -Her CRP trending down, ferritin within normal limit, D-dimers is stable, which is reassuring -Received zinc and vitamin C  COVID-19 Labs  Recent Labs (last 2 labs)    COVID-19 Labs  Recent Labs    12/26/18 1321 12/27/18 0240 12/28/18 0240  DDIMER  --  1.44* 1.44*  FERRITIN 43  --   --   LDH 153  --   --   CRP 8.0* 6.5* 5.5*    Lab Results  Component Value Date   SARSCOV2NAA POSITIVE (A) 12/26/2018     Recent Labs  Lab Results  Component Value Date   SARSCOV2NAA POSITIVE (A) 12/26/2018     Abnormal finding on CT chest with left perihilar lymphadenopathy/small pulmonary nodule -Patient will need outpatient follow-up for lymphoproliferative work-up , need referral to hematology service as an outpatient, likely will need PET-CT per radiology recommendation.  This has been discussed at length with her son, and he understands the importance of the follow-up to rule out malignancy,  Hyponatremia -Due to volume depletion, proved with IV fluids, it is 132 on discharge  Hyperlipidemia -Continue with statin  Diabetes mellitus -Resume home meds on discharge    Discharge Condition:   Follow UP  Follow-up Information    Inc, [facility] Follow up.   Contact information: [address] [place] [address] [phone]             Discharge Instructions  and  Discharge Medications     Discharge Instructions    Discharge instructions    Complete by: As directed    Follow with Primary MD Inc, [facility] in 7 days   Get CBC, CMP,  checked  by Primary MD next visit.    Activity: As tolerated with Full fall precautions use walker/cane & assistance as needed   Disposition Home    Diet: Heart Healthy carb modified, with feeding assistance and aspiration precautions.  For Heart failure patients - Check your Weight same time everyday, if you gain over 2 pounds, or you develop in leg swelling, experience more shortness of breath or chest pain, call your Primary MD immediately. Follow Cardiac Low Salt Diet and 1.5 lit/day fluid restriction.   On your next visit with your primary care physician please Get Medicines reviewed and adjusted.   Please request your Prim.MD to go over all Hospital Tests and Procedure/Radiological results at the follow up, please get all Hospital records sent to your Prim MD by signing hospital release before you go home.   If you experience worsening of your admission symptoms, develop shortness of breath, life threatening emergency, suicidal or homicidal thoughts you must seek medical attention immediately by calling 911 or calling your MD immediately  if symptoms less severe.  You Must read complete instructions/literature along with all the possible adverse reactions/side effects for all the Medicines you take and that have been prescribed to you. Take any new Medicines after you have completely understood and accpet all the possible adverse reactions/side effects.   Do not drive, operating heavy machinery, perform activities at heights, swimming or participation in water activities or provide baby sitting services if your were admitted for syncope or siezures until you have seen by Primary MD or a Neurologist and advised to do so again.  Do not drive when taking Pain medications.    Do not take more than prescribed Pain, Sleep and Anxiety Medications  Special Instructions: If you  have smoked or chewed Tobacco  in the last 2 yrs please stop smoking, stop any regular Alcohol  and or any Recreational drug use.  Wear Seat belts while driving.   Please note  You were cared for by a hospitalist during your hospital stay. If you have any questions about your discharge medications or the care you received while you were in the hospital after you are discharged, you can call the unit and asked to speak with the hospitalist on call if the hospitalist that took care of you is not available. Once you are discharged, your primary care physician will handle any further  medical issues. Please note that NO REFILLS for any discharge medications will be authorized once you are discharged, as it is imperative that you return to your primary care physician (or establish a relationship with a primary care physician if you do not have one) for your aftercare needs so that they can reassess your need for medications and monitor your lab values.   Increase activity slowly   Complete by: As directed    MyChart COVID-19 home monitoring program   Complete by: Dec 28, 2018    Is the patient willing to use the [facility] for home monitoring?: Yes   Temperature monitoring   Complete by: Dec 28, 2018    After how many days would you like to receive a notification of this patient's flowsheet entries?: 1     Allergies as of 12/28/2018   No Known Allergies     Medication List    TAKE these medications   atorvastatin 40 MG tablet Commonly known as: LIPITOR Take 40 mg by mouth daily.   dexamethasone 6 MG tablet Commonly known as: DECADRON Take 1 tablet (6 mg total) by mouth 2 (two) times daily with a meal.   hydrOXYzine 10 MG tablet Commonly known as: ATARAX/VISTARIL Take 10 mg by mouth at bedtime as needed (sleep).   insulin NPH-regular Human (70-30) 100 UNIT/ML injection Inject 20 Units into the skin 2 (two) times daily with a meal.   metFORMIN 1000 MG tablet Commonly known as:  GLUCOPHAGE Take 1,000 mg by mouth 2 (two) times daily with a meal.   pantoprazole 40 MG tablet Commonly known as: PROTONIX Take 40 mg by mouth daily.   sertraline 50 MG tablet Commonly known as: ZOLOFT Take 50 mg by mouth daily.         Diet and Activity recommendation: See Discharge Instructions above   Consults obtained - None   Major procedures and Radiology Reports - PLEASE review detailed and final reports for all details, in brief -      Ct Chest W Contrast  Result Date: 12/26/2018 CLINICAL DATA:  Abnormal lung bases on abdominal CT. EXAM: CT CHEST WITH CONTRAST TECHNIQUE: Multidetector CT imaging of the chest was performed during intravenous contrast administration. CONTRAST:  45mL OMNIPAQUE IOHEXOL 300 MG/ML  SOLN COMPARISON:  Abdominal CT earlier this day. FINDINGS: Cardiovascular: Aortic atherosclerosis. No aneurysm. No evidence of dissection. Borderline cardiomegaly. Coronary artery calcifications. Minimal fluid in the pericardial recess without significant pericardial effusion. Mediastinum/Nodes: Mild multifocal mediastinal and left hilar adenopathy. Largest lymph node in the subcarinal station measuring 19 mm short axis. Lower paratracheal node measures 13 mm short axis. Upper paratracheal nodes are prominent but subcentimeter. Right supraclavicular node measures 12 mm. There is no axillary adenopathy. Left hilar nodes are not well-defined, however measures approximately 11 mm. There is also ill-defined soft tissue density at the left hilum. Esophagus is decompressed. No visualized thyroid nodule. Lungs/Pleura: Anteromedial left lower lobe nodule measuring 13 mm, series 3 image 103, adjacent to the distal esophagus. Ill-defined left perihilar soft tissue density without dominant mass, however there is partial obstruction of segmental bronchus in the left lower lobe, series 3, image 77. Associated linear patchy opacities in the more distal segment may be atelectasis, but is  nonspecific. Subpleural left upper lobe nodule measuring 6 mm, image 49 series 3. The right lung is clear. No pulmonary edema or pleural fluid. Trachea and mainstem bronchi are patent. Upper Abdomen: Assessed on abdominal CT earlier this day. Musculoskeletal: Degenerative change in  the spine. Posterior disc osteophyte complex at T6-T7 causes mass effect and narrowing of the spinal canal. There are no acute or suspicious osseous abnormalities. IMPRESSION: 1. Mild mediastinal, left hilar and right supraclavicular adenopathy. Largest lymph node is subcarinal measuring 19 mm short axis. 2. Additional ill-defined soft tissue density in the left hilum causes partial obstruction of segmental lower lobe bronchus. Distal opacities in the left lower lobe favor atelectasis, but are nonspecific. 3. Anteromedial left lower lobe pulmonary nodule measuring 13 mm. Single additional subpleural left upper lobe 6 mm pulmonary nodule. 4. Recommend lymphoproliferative workup and specialist consultation. PET CT may be of value to assess pulmonary nodule as well as evaluate for left hilar metabolic activity. 5. Aortic atherosclerosis and coronary artery calcifications. Electronically Signed   By: Narda RutherfordMelanie  Sanford M.D.   On: 12/26/2018 03:31   Ct Abdomen Pelvis W Contrast  Result Date: 12/26/2018 CLINICAL DATA:  Acute abdominal pain. EXAM: CT ABDOMEN AND PELVIS WITH CONTRAST TECHNIQUE: Multidetector CT imaging of the abdomen and pelvis was performed using the standard protocol following bolus administration of intravenous contrast. CONTRAST:  100mL OMNIPAQUE IOHEXOL 300 MG/ML  SOLN COMPARISON:  None. FINDINGS: Lower chest: Partially included increased soft tissue density in the left infrahilar region with probable enlarged lymph node,, image 2 series 2. Associated streaky, linear, and ground-glass opacities in the left lower lobe. Questionable 15 mm pulmonary nodule in the anteromedial left lower lobe, image 13 series 4. This abuts the  esophagus at the diaphragmatic hiatus. Coronary artery calcifications. Hepatobiliary: No focal hepatic lesion. Prominent size liver. Calcified gallstone in the gallbladder neck. No abnormal gallbladder distention or pericholecystic inflammation. No biliary dilatation. Pancreas: No ductal dilatation or inflammation. Spleen: Normal in size without focal abnormality. Adrenals/Urinary Tract: Normal adrenal glands. No hydronephrosis or perinephric edema. Homogeneous renal enhancement with symmetric excretion on delayed phase imaging. Urinary bladder is physiologically distended without wall thickening. Stomach/Bowel: Stomach is unremarkable. No small bowel wall thickening, inflammatory change, or obstruction. Normal appendix. Moderate volume of stool throughout the colon. Colonic wall thickening or inflammatory change. Sigmoid colon is tortuous. Vascular/Lymphatic: Mild aortic atherosclerosis. No aneurysm. No enlarged lymph nodes in the abdomen or pelvis. Reproductive: Prominent periuterine vascularity and dilatation of the ovarian veins, left greater than right. Left ovarian vein measures 8 mm, right ovarian vein measures 6 mm. No suspicious adnexal mass. Unremarkable CT appearance of the uterus. Other: Small fat containing umbilical hernia with diastasis of rectus abdominis. Minimal soft tissue density in the right anterior abdominal wall, nonspecific but may be seen with injection site. No ascites or free air. Musculoskeletal: There are no acute or suspicious osseous abnormalities. IMPRESSION: 1. No acute abnormality in the abdomen/pelvis. 2. Prominent periuterine vascularity and dilatation of the ovarian veins, left greater than right, can be seen with pelvic congestion syndrome in the appropriate clinical setting. 3. Cholelithiasis without gallbladder inflammation. 4. Abnormal left infrahilar soft tissue density in the lung bases with streaky and ground-glass opacities in the left lower lobe. Questionable left  infrahilar adenopathy and possible 15 mm pulmonary nodule adjacent to the distal esophagus. Recommend dedicated chest CT for further evaluation, preferably with IV contrast if renal function permits. Aortic Atherosclerosis (ICD10-I70.0). Electronically Signed   By: Narda RutherfordMelanie  Sanford M.D.   On: 12/26/2018 02:11    Micro Results    Recent Results (from the past 240 hour(s))  Urine Culture     Status: Abnormal   Collection Time: 12/26/18  2:20 AM   Specimen: Urine, Random  Result Value Ref  Range Status   Specimen Description   Final    URINE, RANDOM Performed at [facility], [address] [place], [place] [address]    Special Requests   Final    Normal Performed at [facility], [address] [place], [place] [address]    Culture MULTIPLE SPECIES PRESENT, SUGGEST RECOLLECTION (A)  Final   Report Status 12/27/2018 FINAL  Final  SARS Coronavirus 2 [facility] order, Performed in [facility] hospital lab) Nasopharyngeal     Status: Abnormal   Collection Time: 12/26/18  2:21 AM   Specimen: Nasopharyngeal  Result Value Ref Range Status   SARS Coronavirus 2 POSITIVE (A) NEGATIVE Final    Comment: RESULT CALLED TO, READ BACK BY AND VERIFIED WITH: LEIGH FERGUSON ON 12/26/18 AT 0411 [facility] (NOTE) If result is NEGATIVE SARS-CoV-2 target nucleic acids are NOT DETECTED. The SARS-CoV-2 RNA is generally detectable in upper and lower  respiratory specimens during the acute phase of infection. The lowest  concentration of SARS-CoV-2 viral copies this assay can detect is 250  copies / mL. A negative result does not preclude SARS-CoV-2 infection  and should not be used as the sole basis for treatment or other  patient management decisions.  A negative result may occur with  improper specimen collection / handling, submission of specimen other  than nasopharyngeal swab, presence of viral mutation(s) within the  areas targeted by this assay, and inadequate number of viral copies    (<250 copies / mL). A negative result must be combined with clinical  observations, patient history, and epidemiological information. If result is POSITIVE SARS-CoV-2 target nucleic acids are DETECTED.  The SARS-CoV-2 RNA is generally detectable in upper and lower  respiratory specimens during the acute phase of infection.  Positive  results are indicative of active infection with SARS-CoV-2.  Clinical  correlation with patient history and other diagnostic information is  necessary to determine patient infection status.  Positive results do  not rule out bacterial infection or co-infection with other viruses. If result is PRESUMPTIVE POSTIVE SARS-CoV-2 nucleic acids MAY BE PRESENT.   A presumptive positive result was obtained on the submitted specimen  and confirmed on repeat testing.  While 2019 novel coronavirus  (SARS-CoV-2) nucleic acids may be present in the submitted sample  additional confirmatory testing may be necessary for epidemiological  and / or clinical management purposes  to differentiate between  SARS-CoV-2 and other Sarbecovirus currently known to infect humans.  If clinically indicated additional testing with an alternate test  methodology [phone])  is advised. The SARS-CoV-2 RNA is generally  detectable in upper and lower respiratory specimens during the acute  phase of infection. The expected result is Negative. Fact Sheet for Patients:  BoilerBrush.com.cy Fact Sheet for Healthcare Providers: https://pope.com/ This test is not yet approved or cleared by the [place] FDA and has been authorized for detection and/or diagnosis of SARS-CoV-2 by FDA under an Emergency Use Authorization (EUA).  This EUA will remain in effect (meaning this test can be used) for the duration of the COVID-19 declaration under Section 564(b)(1) of the Act, 21 U.S.C. section 360bbb-3(b)(1), unless the authorization is terminated  or revoked sooner. Performed at [facility], [address] [place], [place] [address]   CULTURE, BLOOD (ROUTINE X 2) w Reflex to ID Panel     Status: None (Preliminary result)   Collection Time: 12/26/18  5:00 AM   Specimen: BLOOD  Result Value Ref Range Status   Specimen Description BLOOD  RIGHT ANTECUBITAL  Final   Special Requests   Final    BOTTLES DRAWN AEROBIC AND ANAEROBIC Blood Culture adequate volume   Culture   Final    NO GROWTH 2 DAYS Performed at [facility], [address] [place], [place] [address]    Report Status PENDING  Incomplete  CULTURE, BLOOD (ROUTINE X 2) w Reflex to ID Panel     Status: None (Preliminary result)   Collection Time: 12/26/18  5:30 AM   Specimen: BLOOD  Result Value Ref Range Status   Specimen Description BLOOD RIGHT HAND  Final   Special Requests   Final    BOTTLES DRAWN AEROBIC AND ANAEROBIC Blood Culture adequate volume   Culture   Final    NO GROWTH 2 DAYS Performed at [facility], [address] [place], [place] [address]    Report Status PENDING  Incomplete       Today   Subjective:   Tara Salinas today has no headache,no chest or  abdominal pain,no new weakness tingling or numbness, feels much better wants to go home today.  No nausea or vomiting, tolerating oral intake  Objective:   Blood pressure (!) 143/65, pulse 86, temperature 98.1 F (36.7 C), temperature source Oral, resp. rate 14, height 5' (1.524 m), weight 91 kg, SpO2 99 %.   Intake/Output Summary (Last 24 hours) at 12/28/2018 1709 Last data filed at 12/27/2018 1800 Gross per 24 hour  Intake 143.74 ml  Output --  Net 143.74 ml    Exam Awake Alert, Oriented x 3, No new F.N deficits, Normal affect Symmetrical Chest wall movement, Good air movement bilaterally, CTAB RRR,No Gallops,Rubs or new Murmurs, No Parasternal Heave +ve B.Sounds, Abd Soft, Non tender,  No rebound -guarding or rigidity. No Cyanosis, Clubbing  or edema, No new Rash or bruise  Data Review   CBC w Diff:  Lab Results  Component Value Date   WBC 5.3 12/28/2018   HGB 11.2 (L) 12/28/2018   HGB 14.5 08/11/2011   HCT 33.5 (L) 12/28/2018   HCT 43.0 08/11/2011   PLT 348 12/28/2018   PLT 235 08/11/2011   LYMPHOPCT 38 12/28/2018   MONOPCT 12 12/28/2018   EOSPCT 1 12/28/2018   BASOPCT 2 12/28/2018    CMP:  Lab Results  Component Value Date   NA 132 (L) 12/28/2018   NA 138 08/11/2011   K 3.4 (L) 12/28/2018   K 3.7 08/11/2011   CL 92 (L) 12/28/2018   CL 98 08/11/2011   CO2 29 12/28/2018   CO2 30 08/11/2011   BUN 10 12/28/2018   BUN 23 (H) 08/11/2011   CREATININE 0.54 12/28/2018   CREATININE 0.71 08/11/2011   PROT 7.5 12/28/2018   PROT 7.7 08/11/2011   ALBUMIN 3.1 (L) 12/28/2018   ALBUMIN 3.6 08/11/2011   BILITOT 0.4 12/28/2018   BILITOT 0.5 08/11/2011   ALKPHOS 82 12/28/2018   ALKPHOS 88 08/11/2011   AST 20 12/28/2018   AST 25 08/11/2011   ALT 23 12/28/2018   ALT 24 08/11/2011  .   Total Time in preparing paper work, data evaluation and todays exam - 35 minutes  Huey Bienenstockawood Lilianah Buffin M.D on 12/28/2018 at 5:09 PM  [facility]   Office  [phone]

## 2018-12-28 NOTE — Discharge Instructions (Signed)
Person Under Monitoring Name: Tara Salinas  Location: [address] [place]  [address]   Infection Prevention Recommendations for Individuals Confirmed to have, or Being Evaluated for, 2019 Novel Coronavirus (COVID-19) Infection Who Receive Care at Home  Individuals who are confirmed to have, or are being evaluated for, COVID-19 should follow the prevention steps below until a healthcare provider or local or state health department says they can return to normal activities.  Stay home except to get medical care You should restrict activities outside your home, except for getting medical care. Do not go to work, school, or public areas, and do not use public transportation or taxis.  Call ahead before visiting your doctor Before your medical appointment, call the healthcare provider and tell them that you have, or are being evaluated for, COVID-19 infection. This will help the healthcare providers office take steps to keep other people from getting infected. Ask your healthcare provider to call the local or state health department.  Monitor your symptoms Seek prompt medical attention if your illness is worsening (e.g., difficulty breathing). Before going to your medical appointment, call the healthcare provider and tell them that you have, or are being evaluated for, COVID-19 infection. Ask your healthcare provider to call the local or state health department.  Wear a facemask You should wear a facemask that covers your nose and mouth when you are in the same room with other people and when you visit a healthcare provider. People who live with or visit you should also wear a facemask while they are in the same room with you.  Separate yourself from other people in your home As much as possible, you should stay in a different room from other people in your home. Also, you should use a separate bathroom, if available.  Avoid sharing household items You  should not share dishes, drinking glasses, cups, eating utensils, towels, bedding, or other items with other people in your home. After using these items, you should wash them thoroughly with soap and water.  Cover your coughs and sneezes Cover your mouth and nose with a tissue when you cough or sneeze, or you can cough or sneeze into your sleeve. Throw used tissues in a lined trash can, and immediately wash your hands with soap and water for at least 20 seconds or use an alcohol-based hand rub.  Wash your [facility] your hands often and thoroughly with soap and water for at least 20 seconds. You can use an alcohol-based hand sanitizer if soap and water are not available and if your hands are not visibly dirty. Avoid touching your eyes, nose, and mouth with unwashed hands.   Prevention Steps for Caregivers and Household Members of Individuals Confirmed to have, or Being Evaluated for, COVID-19 Infection Being Cared for in the Home  If you live with, or provide care at home for, a person confirmed to have, or being evaluated for, COVID-19 infection please follow these guidelines to prevent infection:  Follow healthcare providers instructions Make sure that you understand and can help the patient follow any healthcare provider instructions for all care.  Provide for the patients basic needs You should help the patient with basic needs in the home and provide support for getting groceries, prescriptions, and other personal needs.  Monitor the patients symptoms If they are getting sicker, call his or her medical provider and tell them that the patient has, or is being evaluated for, COVID-19 infection. This will help the healthcare  providers office take steps to keep other people from getting infected. Ask the healthcare provider to call the local or state health department.  Limit the number of people who have contact with the patient  If possible, have only one caregiver for the  patient.  Other household members should stay in another home or place of residence. If this is not possible, they should stay  in another room, or be separated from the patient as much as possible. Use a separate bathroom, if available.  Restrict visitors who do not have an essential need to be in the home.  Keep older adults, very young children, and other sick people away from the patient Keep older adults, very young children, and those who have compromised immune systems or chronic health conditions away from the patient. This includes people with chronic heart, lung, or kidney conditions, diabetes, and cancer.  Ensure good ventilation Make sure that shared spaces in the home have good air flow, such as from an air conditioner or an opened window, weather permitting.  Wash your hands often  Wash your hands often and thoroughly with soap and water for at least 20 seconds. You can use an alcohol based hand sanitizer if soap and water are not available and if your hands are not visibly dirty.  Avoid touching your eyes, nose, and mouth with unwashed hands.  Use disposable paper towels to dry your hands. If not available, use dedicated cloth towels and replace them when they become wet.  Wear a facemask and gloves  Wear a disposable facemask at all times in the room and gloves when you touch or have contact with the patients blood, body fluids, and/or secretions or excretions, such as sweat, saliva, sputum, nasal mucus, vomit, urine, or feces.  Ensure the mask fits over your nose and mouth tightly, and do not touch it during use.  Throw out disposable facemasks and gloves after using them. Do not reuse.  Wash your hands immediately after removing your facemask and gloves.  If your personal clothing becomes contaminated, carefully remove clothing and launder. Wash your hands after handling contaminated clothing.  Place all used disposable facemasks, gloves, and other waste in a lined  container before disposing them with other household waste.  Remove gloves and wash your hands immediately after handling these items.  Do not share dishes, glasses, or other household items with the patient  Avoid sharing household items. You should not share dishes, drinking glasses, cups, eating utensils, towels, bedding, or other items with a patient who is confirmed to have, or being evaluated for, COVID-19 infection.  After the person uses these items, you should wash them thoroughly with soap and water.  Wash laundry thoroughly  Immediately remove and wash clothes or bedding that have blood, body fluids, and/or secretions or excretions, such as sweat, saliva, sputum, nasal mucus, vomit, urine, or feces, on them.  Wear gloves when handling laundry from the patient.  Read and follow directions on labels of laundry or clothing items and detergent. In general, wash and dry with the warmest temperatures recommended on the label.  Clean all areas the individual has used often  Clean all touchable surfaces, such as counters, tabletops, doorknobs, bathroom fixtures, toilets, phones, keyboards, tablets, and bedside tables, every day. Also, clean any surfaces that may have blood, body fluids, and/or secretions or excretions on them.  Wear gloves when cleaning surfaces the patient has come in contact with.  Use a diluted bleach solution (e.g., dilute bleach with  1 part bleach and 10 parts water) or a household disinfectant with a label that says EPA-registered for coronaviruses. To make a bleach solution at home, add 1 tablespoon of bleach to 1 quart (4 cups) of water. For a larger supply, add  cup of bleach to 1 gallon (16 cups) of water.  Read labels of cleaning products and follow recommendations provided on product labels. Labels contain instructions for safe and effective use of the cleaning product including precautions you should take when applying the product, such as wearing gloves or  eye protection and making sure you have good ventilation during use of the product.  Remove gloves and wash hands immediately after cleaning.  Monitor yourself for signs and symptoms of illness Caregivers and household members are considered close contacts, should monitor their health, and will be asked to limit movement outside of the home to the extent possible. Follow the monitoring steps for close contacts listed on the symptom monitoring form.   ? If you have additional questions, contact your local health department or call the epidemiologist on call at [phone] (available 24/7). ? This guidance is subject to change. For the most up-to-date guidance from [facility], please refer to their website: [email or web address]

## 2018-12-29 NOTE — Progress Notes (Signed)
Late Entry Patient was discharged 8/19, Patient was discharged to home.  AVS reviewed via interpreter including quarantine guidelines, medications, and follow up. All questions answered, prescriptions given to patient, no 02 required at discharge, public health contract signed and placed in chart, IV's removed, and family contacted to provide transportation. Patient was still on the floor at 7pm.

## 2018-12-31 LAB — CULTURE, BLOOD (ROUTINE X 2)
Culture: NO GROWTH
Culture: NO GROWTH
Special Requests: ADEQUATE
Special Requests: ADEQUATE

## 2019-01-10 LAB — BLOOD GAS, VENOUS
Acid-Base Excess: 7 mmol/L — ABNORMAL HIGH (ref 0.0–2.0)
Bicarbonate: 34.8 mmol/L — ABNORMAL HIGH (ref 20.0–28.0)
O2 Saturation: 39 %
Patient temperature: 37
pCO2, Ven: 63 mmHg — ABNORMAL HIGH (ref 44.0–60.0)
pH, Ven: 7.35 (ref 7.250–7.430)

## 2019-06-01 ENCOUNTER — Other Ambulatory Visit: Payer: Self-pay

## 2019-06-01 ENCOUNTER — Encounter: Payer: Self-pay | Admitting: *Deleted

## 2019-06-05 ENCOUNTER — Encounter: Payer: Self-pay | Admitting: Anesthesiology

## 2019-06-05 NOTE — Progress Notes (Signed)
Pt saw PCP this AM.  PCP feels surgery should be postponed.  Pt's last A1C was 11.  New labs have been ordered.  BP was elevated.  Pt has a history of A-Fib. Unable to obtain EKG, but one will be done soon.  Pt is not currently taking any meds for these issues. PCP feels pt should be optimized before procedure.  PCP will send office note and labs when new labs return.

## 2019-06-06 ENCOUNTER — Other Ambulatory Visit: Admission: RE | Admit: 2019-06-06 | Payer: Self-pay | Source: Ambulatory Visit

## 2019-06-08 ENCOUNTER — Ambulatory Visit: Admission: RE | Admit: 2019-06-08 | Payer: Self-pay | Source: Home / Self Care

## 2019-06-08 HISTORY — DX: Other complications of anesthesia, initial encounter: T88.59XA

## 2019-06-08 SURGERY — PHACOEMULSIFICATION, CATARACT, WITH IOL INSERTION
Anesthesia: Topical | Laterality: Right

## 2019-07-22 ENCOUNTER — Emergency Department
Admission: EM | Admit: 2019-07-22 | Discharge: 2019-07-22 | Disposition: A | Discharge status: Home / Self Care | Payer: Self-pay | Attending: Emergency Medicine | Admitting: Emergency Medicine

## 2019-07-22 ENCOUNTER — Other Ambulatory Visit: Payer: Self-pay

## 2019-07-22 ENCOUNTER — Emergency Department: Payer: Self-pay

## 2019-07-22 DIAGNOSIS — Z794 Long term (current) use of insulin: Secondary | ICD-10-CM | POA: Insufficient documentation

## 2019-07-22 DIAGNOSIS — Z8616 Personal history of COVID-19: Secondary | ICD-10-CM | POA: Insufficient documentation

## 2019-07-22 DIAGNOSIS — R05 Cough: Secondary | ICD-10-CM

## 2019-07-22 DIAGNOSIS — R0600 Dyspnea, unspecified: Secondary | ICD-10-CM

## 2019-07-22 DIAGNOSIS — R059 Cough, unspecified: Secondary | ICD-10-CM

## 2019-07-22 DIAGNOSIS — E119 Type 2 diabetes mellitus without complications: Secondary | ICD-10-CM | POA: Insufficient documentation

## 2019-07-22 DIAGNOSIS — R0602 Shortness of breath: Secondary | ICD-10-CM | POA: Insufficient documentation

## 2019-07-22 DIAGNOSIS — Z79899 Other long term (current) drug therapy: Secondary | ICD-10-CM | POA: Insufficient documentation

## 2019-07-22 DIAGNOSIS — F419 Anxiety disorder, unspecified: Secondary | ICD-10-CM | POA: Insufficient documentation

## 2019-07-22 DIAGNOSIS — Z20822 Contact with and (suspected) exposure to covid-19: Secondary | ICD-10-CM | POA: Insufficient documentation

## 2019-07-22 LAB — BASIC METABOLIC PANEL
Anion gap: 12 (ref 5–15)
BUN: 12 mg/dL (ref 8–23)
CO2: 26 mmol/L (ref 22–32)
Calcium: 8.6 mg/dL — ABNORMAL LOW (ref 8.9–10.3)
Chloride: 98 mmol/L (ref 98–111)
Creatinine, Ser: 0.7 mg/dL (ref 0.44–1.00)
GFR calc Af Amer: >60 mL/min (ref >60)
GFR calc non Af Amer: >60 mL/min (ref >60)
Glucose, Bld: 108 mg/dL — ABNORMAL HIGH (ref 70–99)
Potassium: 3.7 mmol/L (ref 3.5–5.1)
Sodium: 136 mmol/L (ref 135–145)

## 2019-07-22 LAB — CBC
HCT: 36.5 % (ref 36.0–46.0)
Hemoglobin: 11.9 g/dL — ABNORMAL LOW (ref 12.0–15.0)
MCH: 27.4 pg (ref 26.0–34.0)
MCHC: 32.6 g/dL (ref 30.0–36.0)
MCV: 83.9 fL (ref 80.0–100.0)
Platelets: 285 10*3/uL (ref 150–400)
RBC: 4.35 MIL/uL (ref 3.87–5.11)
RDW: 14.1 % (ref 11.5–15.5)
WBC: 6.4 10*3/uL (ref 4.0–10.5)
nRBC: 0 % (ref 0.0–0.2)

## 2019-07-22 LAB — BRAIN NATRIURETIC PEPTIDE: B Natriuretic Peptide: 38 pg/mL (ref 0.0–100.0)

## 2019-07-22 LAB — POC SARS CORONAVIRUS 2 AG: SARS Coronavirus 2 Ag: NEGATIVE

## 2019-07-22 LAB — TROPONIN I (HIGH SENSITIVITY): Troponin I (High Sensitivity): 3 ng/L (ref <18)

## 2019-07-22 MED ORDER — TRAZODONE HCL 50 MG PO TABS: 50.0000 mg | ORAL_TABLET | Freq: Every day | ORAL | 0 refills | Status: DC

## 2019-07-22 MED ORDER — LORAZEPAM 0.5 MG PO TABS
0.5000 mg | ORAL_TABLET | Freq: Once | ORAL | Status: AC
  Administered 2019-07-22: 0.5 mg via ORAL
  Filled 2019-07-22: qty 1

## 2019-07-22 NOTE — ED Notes (Signed)
Dr. Fredrich Romans and interpreter at bedside

## 2019-07-22 NOTE — ED Notes (Signed)
Pt's discharge completed by ipad interpretor [phone]

## 2019-07-22 NOTE — ED Provider Notes (Signed)
[facility] Emergency Department Provider Note  Time seen: 4:27 PM  I have reviewed the triage vital signs and the nursing notes. Spanish interpreter used for this evaluation.  HISTORY  Chief Complaint Shortness of Breath, Back Pain, and Insomnia   HPI Tara Salinas is a 69 y.o. female with a past medical history of diabetes, gastric reflux, hyperlipidemia, presents to the emergency department for shortness of breath.  According to the patient last night she was feeling like she was suffocating anytime she lied down or lie back.  Patient states this has been an ongoing issue for quite some time.  States she has been dealing with some lower back pain ever since a fall 8 months ago.  States she feels very nervous and anxious at times.  States she feels like she cannot breathe of her bedroom door shut.  States if she stops the door she has to walk around and cannot lay down in the bed because she will feel short of breath.  But states she feels like this is mostly due to nervousness or anxiety.  Denies any chest pain.  States mild shortness of breath currently.  Past Medical History:  Diagnosis Date  . Complication of anesthesia    Reports felling of choking when whe got an epidural.  . COVID-19 12/26/2018  . Diabetes mellitus without complication (HCC)   . GERD (gastroesophageal reflux disease)   . Hyperlipemia     Patient Active Problem List   Diagnosis Date Noted  . Nausea, vomiting and diarrhea 12/27/2018  . Hyponatremia 12/27/2018  . COVID-19 virus infection 12/27/2018  . COVID-19 12/26/2018  . Abdominal pain 12/26/2018    Past Surgical History:  Procedure Laterality Date  . CESAREAN SECTION    . TUBAL LIGATION      Prior to Admission medications   Medication Sig Start Date End Date Taking? Authorizing Provider  atorvastatin (LIPITOR) 40 MG tablet Take 40 mg by mouth daily.    [provider]  dexamethasone (DECADRON) 6 MG tablet  Take 1 tablet (6 mg total) by mouth 2 (two) times daily with a meal. Patient not taking: Reported on 06/01/2019 12/28/18   Elgergawy, Leana Roe, MD  hydrOXYzine (ATARAX/VISTARIL) 10 MG tablet Take 10 mg by mouth at bedtime as needed (sleep).     [provider]  insulin NPH-regular Human (70-30) 100 UNIT/ML injection Inject 20 Units into the skin 2 (two) times daily with a meal.    [provider]  metFORMIN (GLUCOPHAGE) 1000 MG tablet Take 1,000 mg by mouth 2 (two) times daily with a meal.    [provider]  pantoprazole (PROTONIX) 40 MG tablet Take 40 mg by mouth daily.    [provider]  sertraline (ZOLOFT) 50 MG tablet Take 50 mg by mouth daily.    [provider]    No Known Allergies  History reviewed. No pertinent family history.  Social History Social History   Tobacco Use  . Smoking status: Never Smoker  . Smokeless tobacco: Never Used  Substance Use Topics  . Alcohol use: No  . Drug use: No    Review of Systems Constitutional: Negative for fever. Cardiovascular: Negative for chest pain. Respiratory: Shortness of breath mostly at night per patient.  Negative for cough.  Patient had Covid in August. Gastrointestinal: Negative for abdominal pain, vomiting Musculoskeletal: Negative for musculoskeletal complaints Neurological: Negative for headache All other ROS negative  ____________________________________________   PHYSICAL EXAM:  VITAL SIGNS: ED Triage  Vitals  Enc Vitals Group     BP 07/22/19 1528 131/61     Pulse Rate 07/22/19 1528 94     Resp 07/22/19 1528 18     Temp 07/22/19 1528 99.1 F (37.3 C)     Temp Source 07/22/19 1528 Oral     SpO2 07/22/19 1528 99 %     Weight 07/22/19 1532 264 lb 8.8 oz (120 kg)     Height 07/22/19 1532 5' (1.524 m)     Head Circumference --      Peak Flow --      Pain Score 07/22/19 1531 10     Pain Loc --      Pain Edu? --      Excl. in [place]? --    Constitutional: Patient is  awake and alert, no acute distress.  Patient does appear anxious, pacing at times. Eyes: Normal exam ENT      Head: Normocephalic and atraumatic.      Mouth/Throat: Mucous membranes are moist. Cardiovascular: Normal rate, regular rhythm.  Respiratory: Normal respiratory effort without tachypnea nor retractions. Breath sounds are clear Gastrointestinal: Soft and nontender. No distention.  Musculoskeletal: Nontender with normal range of motion in all extremities.  Neurologic:  No gross focal neurologic deficits Skin:  Skin is warm, dry and intact.  Psychiatric: Mood and affect are normal  ____________________________________________    EKG  EKG viewed and interpreted by myself shows a normal sinus rhythm at 92 bpm with a narrow QRS, normal axis, normal intervals, no concerning ST changes.  ____________________________________________    RADIOLOGY  IMPRESSION:  Peribronchial thickening suggesting bronchitis. Borderline  cardiomegaly.   ____________________________________________   INITIAL IMPRESSION / ASSESSMENT AND PLAN / ED COURSE  Pertinent labs & imaging results that were available during my care of the patient were reviewed by me and considered in my medical decision making (see chart for details).   Patient presents to the emergency department for shortness of breath.  States it has been an ongoing issue for mostly at night if she tries to lay down or if she closes her bedroom door.  Patient is quite anxious in appearance.  Highly suspect anxiety could be playing a role in the patient's symptoms.  However we will check labs including cardiac enzymes, BNP, chest x-ray and continue to closely monitor.  Patient agreeable to plan of care.  We will also dose a small dose of Ativan to see if this helps with the patient's symptoms.  Patient is feeling much better.  Patient is concerned that the nervousness might come back tonight, states he has not been sleeping well because of  this.  We will place the patient on trazodone 50 mg nightly.  Patient will follow up with her doctor.  Discussed my typical shortness of breath return precautions.  Adlyn Fife was evaluated in Emergency Department on 07/22/2019 for the symptoms described in the history of present illness. She was evaluated in the context of the global COVID-19 pandemic, which necessitated consideration that the patient might be at risk for infection with the SARS-CoV-2 virus that causes COVID-19. Institutional protocols and algorithms that pertain to the evaluation of patients at risk for COVID-19 are in a state of rapid change based on information released by regulatory bodies including the CDC and federal and state organizations. These policies and algorithms were followed during the patient's care in the ED.  ____________________________________________   FINAL CLINICAL IMPRESSION(S) / ED DIAGNOSES  Dyspnea   Harvest Dark, MD  07/22/19 1809  

## 2019-07-22 NOTE — ED Triage Notes (Signed)
Spanish interpreter on ipad used for triage.   Pt states she wasn't able to sleep last night because she kept choking when she laid down,states she couldn't breath. Coughing in triage. States back pain. Began last night. Denies fever.   A&O, ambulatory.

## 2019-08-31 ENCOUNTER — Encounter: Payer: Self-pay | Admitting: Ophthalmology

## 2019-08-31 ENCOUNTER — Other Ambulatory Visit: Payer: Self-pay

## 2019-09-05 ENCOUNTER — Other Ambulatory Visit
Admission: RE | Admit: 2019-09-05 | Discharge: 2019-09-05 | Disposition: A | Discharge status: Home / Self Care | Payer: Self-pay | Source: Ambulatory Visit | Attending: Ophthalmology | Admitting: Ophthalmology

## 2019-09-05 DIAGNOSIS — Z20822 Contact with and (suspected) exposure to covid-19: Secondary | ICD-10-CM | POA: Insufficient documentation

## 2019-09-05 DIAGNOSIS — Z01812 Encounter for preprocedural laboratory examination: Secondary | ICD-10-CM | POA: Insufficient documentation

## 2019-09-05 LAB — SARS CORONAVIRUS 2 (TAT 6-24 HRS): SARS Coronavirus 2: NEGATIVE

## 2019-09-05 NOTE — Discharge Instructions (Signed)
Ciruga de cataratas, cuidados posteriores Cataract Surgery, Care After Esta hoja le brinda informacin sobre cmo cuidarse despus del procedimiento. El mdico tambin podr darle indicaciones ms especficas. Comunquese con el mdico si tiene problemas o preguntas. Qu puedo esperar despus del procedimiento? Despus del procedimiento, es comn tener los siguientes sntomas:  Picazn.  Molestias.  Secrecin lquida.  Sensibilidad a la luz y al tacto.  Moretones en el ojo o a su alrededor.  Leve visin borrosa. Sigue estas indicaciones en tu casa: Cuidado de los ojos   No se toque ni se frote los ojos.  Protjase los ojos como se lo haya indicado el mdico. Tal vez le indiquen que use una proteccin ocular resistente o gafas de sol.  No use lentes de contacto en el ojo o los ojos afectados hasta que el mdico lo autorice.  Mantenga la zona que rodea el ojo limpia y seca: ? Evite nadar. ? No permita que el agua le caiga directamente en el rostro cuando se ducha. ? Evite que el jabn o el champ entren en sus ojos.  Revsese el ojo todos los das para detectar signos de infeccin. Est atento a lo siguiente: ? Enrojecimiento, hinchazn o dolor. ? Lquido, sangre o pus. ? Calor. ? Mal olor. ? Visin que empeora. ? Sensibilidad que empeora. Actividad  No conduzca durante 24horas si le administraron un sedante durante el procedimiento.  Evite las actividades extenuantes, como practicar deportes de contacto, durante el tiempo que le haya indicado el mdico.  No conduzca ni opere maquinaria pesada hasta que el mdico lo autorice.  No se incline ni levante objetos pesados. Al agacharse, aumenta la presin en los ojos. Puede caminar, subir escaleras y realizar trabajos domsticos livianos.  Pregunte a su mdico cundo podr volver al trabajo. Si trabaja en un ambiente con polvo, podrn indicarle que use una proteccin ocular durante cierto tiempo. Indicaciones  generales  Tome o aplquese los medicamentos de venta libre y los recetados solamente como se lo haya indicado el mdico. Esto incluye las gotas oftlmicas.  Concurra a todas las visitas de seguimiento como se lo haya indicado el mdico. Esto es importante. Comunquese con un mdico si:  Observa un aumento del hematoma alrededor del ojo.  Siente dolor que no se alivia con los medicamentos.  Fiebre.  Presenta enrojecimiento, hinchazn o dolor en el ojo.  Observa lquido, sangre o pus que emanan de la incisin.  La visin empeora.  La sensibilidad a la luz empeora. Solicite ayuda inmediatamente si:  Pierde la visin repentinamente.  Ve destellos de luz o manchas (moscas volantes).  Siente dolor intenso en el ojo.  Siente nuseas o vmitos. Resumen  Despus del procedimiento, es habitual tener picazn, molestias, moretones, secrecin de lquido o sensibilidad a la luz.  Siga las instrucciones del mdico acerca de los cuidados para el ojo despus del procedimiento.  No se frote el ojo despus del procedimiento. Puede ser necesario que use proteccin para los ojos o gafas de sol. No use lentes de contacto. Mantenga el rea que rodea el ojo limpia y seca.  Evite actividades que requieran mucho esfuerzo. Estas incluyen practicar deportes y levantar objetos pesados.  Comunquese con un mdico si aumentan los moretones, el dolor no desaparece o tiene fiebre. Solicite ayuda de inmediato si de repente pierde la visin, ve destellos de luz o manchas o tiene un dolor intenso en el ojo. Esta informacin no tiene como fin reemplazar el consejo del mdico. Asegrese de hacerle al mdico cualquier   pregunta que tenga. Document Revised: 12/07/2017 Document Reviewed: 12/07/2017 Elsevier Patient Education  2020 Else[employer] Inc[employer]   Anestesia general en adultos, cuidados posteriores General Anesthesia, Adult, Care After Lea esta informacin sobre cmo cuidarse despus del procedimiento. El  mdico tambin podr darle instrucciones ms especficas. Comunquese con su mdico si tiene problemas o preguntas. Qu puedo esperar despus del procedimiento? Luego del procedimiento, son comunes los siguiente efectos secundarios:  Dolor o molestias en el lugar de la va intravenosa (i.v.).  Nuseas.  Vmitos.  Dolor de garganta.  Dificultad para concentrarse.  Sentir fro o tener escalofros.  Debilidad o cansancio.  Somnolencia y fatiga.  Malestar y dolor corporal. Estos efectos secundarios pueden afectar partes del cuerpo que no estuvieron involucradas en la ciruga. Siga estas indicaciones en su casa:  Durante al menos 24horas despus del procedimiento:  Pdale a un adulto responsable que permanezca con usted. Es importante que alguien cuide de usted hasta que se despierte y est alerta.  Descanse todo lo que sea necesario.  No haga lo siguiente: ? Participar en actividades en las que podra caerse o lastimarse. ? Conducir. ? Operar maquinarias pesadas. ? Beber alcohol. ? Tomar somnferos o medicamentos que causen somnolencia. ? Firmar documentos legales ni tomar decisiones importantes. ? Cuidar a nios por su cuenta. Qu debe comer y beber  Siga las indicaciones del mdico respecto de las restricciones de comidas o bebidas.  Cuando tenga hambre, comience a comer cantidades pequeas de alimentos que sean blandos y fciles de digerir (livianos), como una tostada. Retome su dieta habitual de forma gradual.  Beba suficiente lquido como para mantener la orina de color amarillo plido.  Si vomita, rehidrtese tomando agua, jugo o caldo transparente. Instrucciones generales  Si tiene apnea del sueo, la ciruga y ciertos medicamentos pueden aumentar el riesgo de problemas respiratorios. Siga las indicaciones del mdico respecto al uso de su dispositivo para dormir: ? Siempre que duerma, incluso durante las siestas que tome en el da. ? Mientras tome analgsicos  recetados, medicamentos para dormir o medicamentos que producen somnolencia.  Reanude sus actividades normales segn lo indicado por el mdico. Pregntele al mdico qu actividades son seguras para usted.  Tome los medicamentos de venta libre y los recetados solamente como se lo haya indicado el mdico.  Si fuma, no lo haga sin supervisin.  Concurra a todas las visitas de seguimiento como se lo haya indicado el mdico. Esto es importante. Comunquese con un mdico si:  Tiene nuseas o vmitos que no mejoran con medicamentos.  No puede comer ni beber sin vomitar.  El dolor no se alivia con medicamentos.  No puede orinar.  Tiene una erupcin cutnea.  Tiene fiebre.  Presenta enrojecimiento alrededor del lugar de la va intravenosa (i.v.) que empeora. Solicite ayuda de inmediato si:  Tiene dificultad para respirar.  Siente dolor en el pecho.  Observa sangre en la orina o heces, o vomita sangre. Resumen  Despus del procedimiento, es comn tener dolor de garganta y nuseas. Tambin es comn sentirse cansado.  Pdale a un adulto responsable que permanezca con usted durante 24 horas despus de la anestesia general. Es importante que alguien cuide de usted hasta que se despierte y est alerta.  Cuando tenga hambre, comience a comer cantidades pequeas de alimentos que sean blandos y fciles de digerir (livianos), como una tostada. Retome su dieta habitual de forma gradual.  Beba suficiente lquido como para mantener la orina de color amarillo plido.  Reanude sus actividades normales segn lo indicado   por el mdico. Pregntele al mdico qu actividades son seguras para usted. Esta informacin no tiene como fin reemplazar el consejo del mdico. Asegrese de hacerle al mdico cualquier pregunta que tenga. Document Revised: 02/22/2017 Document Reviewed: 02/22/2017 Elsevier Patient Education  2020 [employer] Inc.  

## 2019-09-07 ENCOUNTER — Ambulatory Visit
Admission: RE | Admit: 2019-09-07 | Discharge: 2019-09-07 | Disposition: A | Discharge status: Home / Self Care | Payer: Self-pay | Attending: Ophthalmology | Admitting: Ophthalmology

## 2019-09-07 ENCOUNTER — Ambulatory Visit: Payer: Self-pay | Admitting: Anesthesiology

## 2019-09-07 ENCOUNTER — Other Ambulatory Visit: Payer: Self-pay

## 2019-09-07 ENCOUNTER — Encounter
Admission: RE | Disposition: A | Discharge status: Home / Self Care | Payer: Self-pay | Source: Home / Self Care | Attending: Ophthalmology

## 2019-09-07 ENCOUNTER — Encounter: Payer: Self-pay | Admitting: Ophthalmology

## 2019-09-07 DIAGNOSIS — E7800 Pure hypercholesterolemia, unspecified: Secondary | ICD-10-CM | POA: Insufficient documentation

## 2019-09-07 DIAGNOSIS — K219 Gastro-esophageal reflux disease without esophagitis: Secondary | ICD-10-CM | POA: Insufficient documentation

## 2019-09-07 DIAGNOSIS — F419 Anxiety disorder, unspecified: Secondary | ICD-10-CM | POA: Insufficient documentation

## 2019-09-07 DIAGNOSIS — I10 Essential (primary) hypertension: Secondary | ICD-10-CM | POA: Insufficient documentation

## 2019-09-07 DIAGNOSIS — H2511 Age-related nuclear cataract, right eye: Secondary | ICD-10-CM | POA: Insufficient documentation

## 2019-09-07 DIAGNOSIS — E1136 Type 2 diabetes mellitus with diabetic cataract: Secondary | ICD-10-CM | POA: Insufficient documentation

## 2019-09-07 DIAGNOSIS — Z8616 Personal history of COVID-19: Secondary | ICD-10-CM | POA: Insufficient documentation

## 2019-09-07 DIAGNOSIS — F329 Major depressive disorder, single episode, unspecified: Secondary | ICD-10-CM | POA: Insufficient documentation

## 2019-09-07 HISTORY — DX: Essential (primary) hypertension: I10

## 2019-09-07 HISTORY — PX: CATARACT EXTRACTION W/PHACO: SHX586

## 2019-09-07 LAB — GLUCOSE, CAPILLARY: Glucose-Capillary: 171 mg/dL — ABNORMAL HIGH (ref 70–99)

## 2019-09-07 SURGERY — PHACOEMULSIFICATION, CATARACT, WITH IOL INSERTION
Anesthesia: Monitor Anesthesia Care | Site: Eye | Laterality: Right

## 2019-09-07 MED ORDER — TETRACAINE HCL 0.5 % OP SOLN
1.0000 [drp] | OPHTHALMIC | Status: DC | PRN
  Administered 2019-09-07 (×3): 1 [drp] via OPHTHALMIC

## 2019-09-07 MED ORDER — EPINEPHRINE PF 1 MG/ML IJ SOLN
INTRAOCULAR | Status: DC | PRN
  Administered 2019-09-07: 151 mL via OPHTHALMIC

## 2019-09-07 MED ORDER — ARMC OPHTHALMIC DILATING DROPS
1.0000 "application " | OPHTHALMIC | Status: DC | PRN
  Administered 2019-09-07 (×3): 1 via OPHTHALMIC

## 2019-09-07 MED ORDER — MOXIFLOXACIN HCL 0.5 % OP SOLN
OPHTHALMIC | Status: DC | PRN
  Administered 2019-09-07: 0.2 mL via OPHTHALMIC

## 2019-09-07 MED ORDER — NA CHONDROIT SULF-NA HYALURON 40-17 MG/ML IO SOLN
INTRAOCULAR | Status: DC | PRN
  Administered 2019-09-07: 1 mL via INTRAOCULAR

## 2019-09-07 MED ORDER — TETRACAINE 0.5 % OP SOLN OPTIME - NO CHARGE
OPHTHALMIC | Status: DC | PRN
  Administered 2019-09-07: 2 [drp] via OPHTHALMIC

## 2019-09-07 MED ORDER — LACTATED RINGERS IV SOLN: 100.0000 mL/h | INTRAVENOUS | Status: DC

## 2019-09-07 MED ORDER — LIDOCAINE HCL (PF) 2 % IJ SOLN
INTRAOCULAR | Status: DC | PRN
  Administered 2019-09-07: 2 mL via INTRAOCULAR

## 2019-09-07 MED ORDER — BRIMONIDINE TARTRATE-TIMOLOL 0.2-0.5 % OP SOLN
OPHTHALMIC | Status: DC | PRN
  Administered 2019-09-07: 1 [drp] via OPHTHALMIC

## 2019-09-07 MED ORDER — PROVISC 10 MG/ML IO SOLN
INTRAOCULAR | Status: DC | PRN
  Administered 2019-09-07: 0.55 mL via INTRAOCULAR

## 2019-09-07 MED ORDER — MIDAZOLAM HCL 2 MG/2ML IJ SOLN
INTRAMUSCULAR | Status: DC | PRN
  Administered 2019-09-07: 1 mg via INTRAVENOUS

## 2019-09-07 MED ORDER — TRYPAN BLUE 0.06 % OP SOLN
OPHTHALMIC | Status: DC | PRN
  Administered 2019-09-07: 0.5 mL via INTRAOCULAR

## 2019-09-07 MED ORDER — NEOMYCIN-POLYMYXIN-DEXAMETH 3.5-10000-0.1 OP OINT
TOPICAL_OINTMENT | OPHTHALMIC | Status: DC | PRN
  Administered 2019-09-07: 1 via OPHTHALMIC

## 2019-09-07 MED ORDER — ACETAMINOPHEN 325 MG PO TABS: 325.0000 mg | ORAL_TABLET | Freq: Once | ORAL | Status: DC

## 2019-09-07 MED ORDER — ACETAMINOPHEN 160 MG/5ML PO SOLN: 325.0000 mg | Freq: Once | ORAL | Status: DC

## 2019-09-07 MED ORDER — FENTANYL CITRATE (PF) 100 MCG/2ML IJ SOLN
INTRAMUSCULAR | Status: DC | PRN
  Administered 2019-09-07: 50 ug via INTRAVENOUS

## 2019-09-07 SURGICAL SUPPLY — 16 items
DISSECTOR HYDRO NUCLEUS 50X22 (MISCELLANEOUS) ×12 IMPLANT
DRSG TEGADERM 2-3/8X2-3/4 SM (GAUZE/BANDAGES/DRESSINGS) ×3 IMPLANT
GLOVE BIOGEL PI IND STRL 8 (GLOVE) ×1 IMPLANT
GLOVE BIOGEL PI INDICATOR 8 (GLOVE) ×2
GOWN STRL REUS W/ TWL LRG LVL3 (GOWN DISPOSABLE) ×1 IMPLANT
GOWN STRL REUS W/ TWL XL LVL3 (GOWN DISPOSABLE) ×1 IMPLANT
GOWN STRL REUS W/TWL LRG LVL3 (GOWN DISPOSABLE) ×2
GOWN STRL REUS W/TWL XL LVL3 (GOWN DISPOSABLE) ×2
KNIFE 45D UP 2.3 (MISCELLANEOUS) ×3 IMPLANT
LENS IOL TECNIS ITEC 25.0 (Intraocular Lens) ×2 IMPLANT
PACK CATARACT (MISCELLANEOUS) ×3 IMPLANT
PACK DR. KING ARMS (PACKS) ×3 IMPLANT
PACK EYE AFTER SURG (MISCELLANEOUS) ×3 IMPLANT
SOLUTION OPHTHALMIC SALT (MISCELLANEOUS) ×3 IMPLANT
WATER STERILE IRR 250ML POUR (IV SOLUTION) ×3 IMPLANT
WIPE NON LINTING 3.25X3.25 (MISCELLANEOUS) ×3 IMPLANT

## 2019-09-07 NOTE — Anesthesia Procedure Notes (Signed)
Procedure Name: MAC Performed by: Izetta Dakin, CRNA Pre-anesthesia Checklist: Timeout performed, Patient being monitored, Suction available, Emergency Drugs available and Patient identified Patient Re-evaluated:Patient Re-evaluated prior to induction Oxygen Delivery Method: Nasal cannula

## 2019-09-07 NOTE — Anesthesia Preprocedure Evaluation (Signed)
Anesthesia Evaluation  Patient identified by MRN, date of birth, ID band Patient awake    Reviewed: Allergy & Precautions, H&P , NPO status , Patient's Chart, lab work & pertinent test results  Airway Mallampati: II  TM Distance: >3 FB Neck ROM: full    Dental  (+) Partial Upper   Pulmonary    Pulmonary exam normal breath sounds clear to auscultation       Cardiovascular hypertension, Normal cardiovascular exam Rhythm:regular Rate:Normal     Neuro/Psych    GI/Hepatic GERD  ,  Endo/Other  diabetes  Renal/GU      Musculoskeletal   Abdominal   Peds  Hematology   Anesthesia Other Findings   Reproductive/Obstetrics                             Anesthesia Physical Anesthesia Plan  ASA: III  Anesthesia Plan: MAC   Post-op Pain Management:    Induction:   PONV Risk Score and Plan: 2 and Treatment may vary due to age or medical condition, TIVA and Midazolam  Airway Management Planned:   Additional Equipment:   Intra-op Plan:   Post-operative Plan:   Informed Consent: I have reviewed the patients History and Physical, chart, labs and discussed the procedure including the risks, benefits and alternatives for the proposed anesthesia with the patient or authorized representative who has indicated his/her understanding and acceptance.     Dental Advisory Given  Plan Discussed with: CRNA  Anesthesia Plan Comments:         Anesthesia Quick Evaluation

## 2019-09-07 NOTE — Anesthesia Procedure Notes (Signed)
Procedure Name: MAC Performed by: Izetta Dakin, CRNA Pre-anesthesia Checklist: Patient identified, Emergency Drugs available, Suction available, Patient being monitored and Timeout performed Patient Re-evaluated:Patient Re-evaluated prior to induction Oxygen Delivery Method: Nasal cannula

## 2019-09-07 NOTE — H&P (Signed)
   I have reviewed the patient's H&P and agree with its findings. There have been no interval changes.  Eugune Sine MD Ophthalmology 

## 2019-09-07 NOTE — Transfer of Care (Signed)
Immediate Anesthesia Transfer of Care Note  Patient: Tara Salinas  Procedure(s) Performed: CATARACT EXTRACTION PHACO AND INTRAOCULAR LENS PLACEMENT (IOC) RIGHT DIABETIC VISION BLUE 18.56 01:35.2 (Right Eye)  Patient Location: PACU  Anesthesia Type: MAC  Level of Consciousness: awake, alert  and patient cooperative  Airway and Oxygen Therapy: Patient Spontanous Breathing and Patient connected to supplemental oxygen  Post-op Assessment: Post-op Vital signs reviewed, Patient's Cardiovascular Status Stable, Respiratory Function Stable, Patent Airway and No signs of Nausea or vomiting  Post-op Vital Signs: Reviewed and stable  Complications: No apparent anesthesia complications

## 2019-09-07 NOTE — Anesthesia Postprocedure Evaluation (Signed)
Anesthesia Post Note  Patient: Tara Salinas  Procedure(s) Performed: CATARACT EXTRACTION PHACO AND INTRAOCULAR LENS PLACEMENT (IOC) RIGHT DIABETIC VISION BLUE 18.56 01:35.2 (Right Eye)     Patient location during evaluation: PACU Anesthesia Type: MAC Level of consciousness: awake and alert and oriented Pain management: satisfactory to patient Vital Signs Assessment: post-procedure vital signs reviewed and stable Respiratory status: spontaneous breathing, nonlabored ventilation and respiratory function stable Cardiovascular status: blood pressure returned to baseline and stable Postop Assessment: Adequate PO intake and No signs of nausea or vomiting Anesthetic complications: no    Raliegh Ip

## 2019-09-07 NOTE — Op Note (Signed)
  PREOPERATIVE DIAGNOSIS:  Nuclear sclerotic cataract of the RIGHT eye.   POSTOPERATIVE DIAGNOSIS:  Nuclear sclerotic cataract of the RIGHT eye.   OPERATIVE PROCEDURE: Cataract surgery OD   SURGEON:  Elliot Cousin, MD.   ANESTHESIA:  Anesthesiologist: Ranee Gosselin, MD CRNA: Omer Jack, CRNA  1.      Managed anesthesia care. 2.     0.50ml of Shugarcaine was instilled following the paracentesis   COMPLICATIONS:  None.   TECHNIQUE:   Divide and conquer   DESCRIPTION OF PROCEDURE:  The patient was examined and consented in the preoperative holding area where the aforementioned topical anesthesia was applied to the RIGHT eye and then brought back to the Operating Room where the RIGHT eye was prepped and draped in the usual sterile ophthalmic fashion and a lid speculum was placed. A paracentesis was created with the side port blade, the anterior chamber was washed out with trypan blue to stain the anterior capsule, and the anterior chamber was filled with viscoelastic. A near clear corneal incision was performed with the steel keratome. A continuous curvilinear capsulorrhexis was performed with a cystotome followed by the capsulorrhexis forceps. Hydrodissection and hydrodelineation were carried out with BSS on a blunt cannula. The lens was removed in a divide and conquer  technique and the remaining cortical material was removed with the irrigation-aspiration handpiece. The capsular bag was inflated with viscoelastic and the lens was placed in the capsular bag without complication. The remaining viscoelastic was removed from the eye with the irrigation-aspiration handpiece. The wounds were hydrated. The anterior chamber was flushed and the eye was inflated to physiologic pressure. 0.76ml Vigamox was placed in the anterior chamber. The wounds were found to be water tight. The eye was dressed with Vigamox. The patient was given protective glasses to wear throughout the day and a shield with which  to sleep tonight. The patient was also given drops with which to begin a drop regimen today and will follow-up with me in one day. Implant Name Type Inv. Item Serial No. Manufacturer Lot No. LRB No. Used Action  LENS IOL DIOP 25.0 - [id number] Intraocular Lens LENS IOL DIOP 25.0 [id number] AMO  Right 1 Implanted    Procedure(s): CATARACT EXTRACTION PHACO AND INTRAOCULAR LENS PLACEMENT (IOC) RIGHT DIABETIC VISION BLUE 18.56 01:35.2 (Right)  Electronically signed: Elliot Cousin 09/07/2019 11:36 AM

## 2019-09-08 ENCOUNTER — Encounter: Payer: Self-pay | Admitting: *Deleted

## 2019-10-04 ENCOUNTER — Other Ambulatory Visit: Payer: Self-pay

## 2019-10-04 ENCOUNTER — Encounter: Payer: Self-pay | Admitting: Ophthalmology

## 2019-10-10 ENCOUNTER — Other Ambulatory Visit: Payer: Self-pay

## 2019-10-10 ENCOUNTER — Other Ambulatory Visit
Admission: RE | Admit: 2019-10-10 | Discharge: 2019-10-10 | Disposition: A | Discharge status: Home / Self Care | Payer: HRSA Program | Source: Ambulatory Visit | Attending: Ophthalmology | Admitting: Ophthalmology

## 2019-10-10 DIAGNOSIS — Z01812 Encounter for preprocedural laboratory examination: Secondary | ICD-10-CM | POA: Insufficient documentation

## 2019-10-10 DIAGNOSIS — Z20822 Contact with and (suspected) exposure to covid-19: Secondary | ICD-10-CM | POA: Insufficient documentation

## 2019-10-10 LAB — SARS CORONAVIRUS 2 (TAT 6-24 HRS): SARS Coronavirus 2: NEGATIVE

## 2019-10-10 NOTE — Discharge Instructions (Signed)

## 2019-10-12 ENCOUNTER — Ambulatory Visit: Payer: Self-pay | Admitting: Anesthesiology

## 2019-10-12 ENCOUNTER — Encounter
Admission: RE | Disposition: A | Discharge status: Home / Self Care | Payer: Self-pay | Source: Ambulatory Visit | Attending: Ophthalmology

## 2019-10-12 ENCOUNTER — Encounter: Payer: Self-pay | Admitting: Ophthalmology

## 2019-10-12 ENCOUNTER — Other Ambulatory Visit: Payer: Self-pay

## 2019-10-12 ENCOUNTER — Ambulatory Visit
Admission: RE | Admit: 2019-10-12 | Discharge: 2019-10-12 | Disposition: A | Discharge status: Home / Self Care | Payer: Self-pay | Source: Ambulatory Visit | Attending: Ophthalmology | Admitting: Ophthalmology

## 2019-10-12 DIAGNOSIS — Z79899 Other long term (current) drug therapy: Secondary | ICD-10-CM | POA: Insufficient documentation

## 2019-10-12 DIAGNOSIS — H2512 Age-related nuclear cataract, left eye: Secondary | ICD-10-CM | POA: Insufficient documentation

## 2019-10-12 DIAGNOSIS — Z9841 Cataract extraction status, right eye: Secondary | ICD-10-CM | POA: Insufficient documentation

## 2019-10-12 DIAGNOSIS — E1136 Type 2 diabetes mellitus with diabetic cataract: Secondary | ICD-10-CM | POA: Insufficient documentation

## 2019-10-12 DIAGNOSIS — F419 Anxiety disorder, unspecified: Secondary | ICD-10-CM | POA: Insufficient documentation

## 2019-10-12 DIAGNOSIS — I10 Essential (primary) hypertension: Secondary | ICD-10-CM | POA: Insufficient documentation

## 2019-10-12 DIAGNOSIS — F329 Major depressive disorder, single episode, unspecified: Secondary | ICD-10-CM | POA: Insufficient documentation

## 2019-10-12 DIAGNOSIS — Z794 Long term (current) use of insulin: Secondary | ICD-10-CM | POA: Insufficient documentation

## 2019-10-12 DIAGNOSIS — K219 Gastro-esophageal reflux disease without esophagitis: Secondary | ICD-10-CM | POA: Insufficient documentation

## 2019-10-12 DIAGNOSIS — Z8616 Personal history of COVID-19: Secondary | ICD-10-CM | POA: Insufficient documentation

## 2019-10-12 DIAGNOSIS — E7800 Pure hypercholesterolemia, unspecified: Secondary | ICD-10-CM | POA: Insufficient documentation

## 2019-10-12 HISTORY — PX: CATARACT EXTRACTION W/PHACO: SHX586

## 2019-10-12 LAB — GLUCOSE, CAPILLARY
Glucose-Capillary: 120 mg/dL — ABNORMAL HIGH (ref 70–99)
Glucose-Capillary: 141 mg/dL — ABNORMAL HIGH (ref 70–99)

## 2019-10-12 SURGERY — PHACOEMULSIFICATION, CATARACT, WITH IOL INSERTION
Anesthesia: Monitor Anesthesia Care | Site: Eye | Laterality: Left

## 2019-10-12 MED ORDER — ONDANSETRON HCL 4 MG/2ML IJ SOLN: 4.0000 mg | Freq: Once | INTRAMUSCULAR | Status: DC | PRN

## 2019-10-12 MED ORDER — NA CHONDROIT SULF-NA HYALURON 40-17 MG/ML IO SOLN
INTRAOCULAR | Status: DC | PRN
  Administered 2019-10-12: 1 mL via INTRAOCULAR

## 2019-10-12 MED ORDER — BRIMONIDINE TARTRATE-TIMOLOL 0.2-0.5 % OP SOLN
OPHTHALMIC | Status: DC | PRN
  Administered 2019-10-12: 1 [drp] via OPHTHALMIC

## 2019-10-12 MED ORDER — FENTANYL CITRATE (PF) 100 MCG/2ML IJ SOLN
INTRAMUSCULAR | Status: DC | PRN
  Administered 2019-10-12 (×2): 25 ug via INTRAVENOUS
  Administered 2019-10-12: 50 ug via INTRAVENOUS

## 2019-10-12 MED ORDER — ACETAMINOPHEN 160 MG/5ML PO SOLN: 325.0000 mg | ORAL | Status: DC | PRN

## 2019-10-12 MED ORDER — ARMC OPHTHALMIC DILATING DROPS
1.0000 "application " | OPHTHALMIC | Status: DC | PRN
  Administered 2019-10-12 (×3): 1 via OPHTHALMIC

## 2019-10-12 MED ORDER — MIDAZOLAM HCL 2 MG/2ML IJ SOLN
INTRAMUSCULAR | Status: DC | PRN
  Administered 2019-10-12: 2 mg via INTRAVENOUS

## 2019-10-12 MED ORDER — TRYPAN BLUE 0.06 % OP SOLN
OPHTHALMIC | Status: DC | PRN
  Administered 2019-10-12: 0.5 mL via INTRAOCULAR

## 2019-10-12 MED ORDER — MOXIFLOXACIN HCL 0.5 % OP SOLN
OPHTHALMIC | Status: DC | PRN
  Administered 2019-10-12: 0.2 mL via OPHTHALMIC

## 2019-10-12 MED ORDER — EPINEPHRINE PF 1 MG/ML IJ SOLN
INTRAOCULAR | Status: DC | PRN
  Administered 2019-10-12: 97 mL via OPHTHALMIC

## 2019-10-12 MED ORDER — ACETAMINOPHEN 325 MG PO TABS: 325.0000 mg | ORAL_TABLET | ORAL | Status: DC | PRN

## 2019-10-12 MED ORDER — LIDOCAINE HCL (PF) 2 % IJ SOLN
INTRAOCULAR | Status: DC | PRN
  Administered 2019-10-12: 1 mL via INTRAOCULAR

## 2019-10-12 MED ORDER — TETRACAINE HCL 0.5 % OP SOLN
1.0000 [drp] | OPHTHALMIC | Status: DC | PRN
  Administered 2019-10-12 (×3): 1 [drp] via OPHTHALMIC

## 2019-10-12 MED ORDER — PROVISC 10 MG/ML IO SOLN
INTRAOCULAR | Status: DC | PRN
  Administered 2019-10-12: 0.55 mL via INTRAOCULAR

## 2019-10-12 MED ORDER — LACTATED RINGERS IV SOLN: 100.0000 mL/h | INTRAVENOUS | Status: DC

## 2019-10-12 MED ORDER — TETRACAINE 0.5 % OP SOLN OPTIME - NO CHARGE
OPHTHALMIC | Status: DC | PRN
  Administered 2019-10-12: 2 [drp] via OPHTHALMIC

## 2019-10-12 SURGICAL SUPPLY — 18 items
DISSECTOR HYDRO NUCLEUS 50X22 (MISCELLANEOUS) ×12 IMPLANT
DRSG TEGADERM 2-3/8X2-3/4 SM (GAUZE/BANDAGES/DRESSINGS) ×3 IMPLANT
GLOVE BIOGEL PI IND STRL 8 (GLOVE) ×1 IMPLANT
GLOVE BIOGEL PI INDICATOR 8 (GLOVE) ×2
GOWN STRL REUS W/ TWL LRG LVL3 (GOWN DISPOSABLE) ×1 IMPLANT
GOWN STRL REUS W/ TWL XL LVL3 (GOWN DISPOSABLE) ×1 IMPLANT
GOWN STRL REUS W/TWL LRG LVL3 (GOWN DISPOSABLE) ×2
GOWN STRL REUS W/TWL XL LVL3 (GOWN DISPOSABLE) ×2
KNIFE 45D UP 2.3 (MISCELLANEOUS) ×3 IMPLANT
LENS IOL DIOP 24.5 (Intraocular Lens) ×3 IMPLANT
LENS IOL TECNIS MONO 24.5 (Intraocular Lens) IMPLANT
MARKER SKIN DUAL TIP RULER LAB (MISCELLANEOUS) ×3 IMPLANT
PACK CATARACT (MISCELLANEOUS) ×3 IMPLANT
PACK DR. KING ARMS (PACKS) ×3 IMPLANT
PACK EYE AFTER SURG (MISCELLANEOUS) ×3 IMPLANT
SOLUTION OPHTHALMIC SALT (MISCELLANEOUS) ×3 IMPLANT
WATER STERILE IRR 250ML POUR (IV SOLUTION) ×3 IMPLANT
WIPE NON LINTING 3.25X3.25 (MISCELLANEOUS) ×3 IMPLANT

## 2019-10-12 NOTE — Anesthesia Preprocedure Evaluation (Signed)
Anesthesia Evaluation  Patient identified by MRN, date of birth, ID band Patient awake    Reviewed: Allergy & Precautions, H&P , NPO status , Patient's Chart, lab work & pertinent test results, reviewed documented beta blocker date and time   Airway Mallampati: II  TM Distance: >3 FB Neck ROM: full    Dental no notable dental hx.    Pulmonary neg pulmonary ROS,    Pulmonary exam normal breath sounds clear to auscultation       Cardiovascular Exercise Tolerance: Good hypertension,  Rhythm:regular Rate:Normal     Neuro/Psych negative neurological ROS  negative psych ROS   GI/Hepatic Neg liver ROS, GERD  ,  Endo/Other  diabetes, Type 2  Renal/GU negative Renal ROS  negative genitourinary   Musculoskeletal   Abdominal   Peds  Hematology negative hematology ROS (+)   Anesthesia Other Findings   Reproductive/Obstetrics negative OB ROS                             Anesthesia Physical Anesthesia Plan  ASA: II  Anesthesia Plan: MAC   Post-op Pain Management:    Induction:   PONV Risk Score and Plan: 2 and Treatment may vary due to age or medical condition and TIVA  Airway Management Planned:   Additional Equipment:   Intra-op Plan:   Post-operative Plan:   Informed Consent: I have reviewed the patients History and Physical, chart, labs and discussed the procedure including the risks, benefits and alternatives for the proposed anesthesia with the patient or authorized representative who has indicated his/her understanding and acceptance.     Dental Advisory Given  Plan Discussed with: CRNA  Anesthesia Plan Comments:         Anesthesia Quick Evaluation

## 2019-10-12 NOTE — Anesthesia Procedure Notes (Signed)
Procedure Name: MAC Date/Time: 10/12/2019 7:40 AM Performed by: Jeannene Patella, CRNA Pre-anesthesia Checklist: Patient identified, Emergency Drugs available, Patient being monitored, Suction available and Timeout performed Patient Re-evaluated:Patient Re-evaluated prior to induction Oxygen Delivery Method: Nasal cannula

## 2019-10-12 NOTE — H&P (Signed)
   I have reviewed the patient's H&P and agree with its findings. There have been no interval changes.  Klarisa Barman MD Ophthalmology 

## 2019-10-12 NOTE — Transfer of Care (Signed)
Immediate Anesthesia Transfer of Care Note  Patient: Tara Salinas  Procedure(s) Performed: CATARACT EXTRACTION PHACO AND INTRAOCULAR LENS PLACEMENT (IOC) LEFT DIABETIC (Left Eye)  Patient Location: PACU  Anesthesia Type: MAC  Level of Consciousness: awake, alert  and patient cooperative  Airway and Oxygen Therapy: Patient Spontanous Breathing and Patient connected to supplemental oxygen  Post-op Assessment: Post-op Vital signs reviewed, Patient's Cardiovascular Status Stable, Respiratory Function Stable, Patent Airway and No signs of Nausea or vomiting  Post-op Vital Signs: Reviewed and stable  Complications: No apparent anesthesia complications

## 2019-10-12 NOTE — Anesthesia Postprocedure Evaluation (Signed)
Anesthesia Post Note  Patient: Tara Salinas  Procedure(s) Performed: CATARACT EXTRACTION PHACO AND INTRAOCULAR LENS PLACEMENT (IOC) LEFT DIABETIC (Left Eye)     Patient location during evaluation: PACU Anesthesia Type: MAC Level of consciousness: awake and alert Pain management: pain level controlled Vital Signs Assessment: post-procedure vital signs reviewed and stable Respiratory status: spontaneous breathing, nonlabored ventilation, respiratory function stable and patient connected to nasal cannula oxygen Cardiovascular status: stable and blood pressure returned to baseline Postop Assessment: no apparent nausea or vomiting Anesthetic complications: no    Alisa Graff

## 2019-10-12 NOTE — Op Note (Signed)
  PREOPERATIVE DIAGNOSIS:  Nuclear sclerotic cataract of the LEFT eye.   POSTOPERATIVE DIAGNOSIS:  Nuclear sclerotic cataract of the LEFT eye.   OPERATIVE PROCEDURE: Cataract surgery OS   SURGEON:  Elliot Cousin, MD.   ANESTHESIA:  Anesthesiologist: Scarlette Slice, MD CRNA: Jinny Blossom, CRNA  1.      Managed anesthesia care. 2.     0.51ml of Shugarcaine was instilled following the paracentesis   COMPLICATIONS:  None.   TECHNIQUE:   Divide and conquer   DESCRIPTION OF PROCEDURE:  The patient was examined and consented in the preoperative holding area where the aforementioned topical anesthesia was applied to the LEFT eye and then brought back to the Operating Room where the left eye was prepped and draped in the usual sterile ophthalmic fashion and a lid speculum was placed. A paracentesis was created with the side port blade, the anterior chamber was washed out with trypan blue to stain the anterior capsule, and the anterior chamber was filled with viscoelastic. A near clear corneal incision was performed with the steel keratome. A continuous curvilinear capsulorrhexis was performed with a cystotome followed by the capsulorrhexis forceps. Hydrodissection and hydrodelineation were carried out with BSS on a blunt cannula. The lens was removed in a divide and conquer  technique and the remaining cortical material was removed with the irrigation-aspiration handpiece. The capsular bag was inflated with viscoelastic and the lens was placed in the capsular bag without complication. The remaining viscoelastic was removed from the eye with the irrigation-aspiration handpiece. The wounds were hydrated. The anterior chamber was flushed and the eye was inflated to physiologic pressure. 0.87ml Vigamox was placed in the anterior chamber. The wounds were found to be water tight. The eye was dressed with Vigamox. The patient was given protective glasses to wear throughout the day and a shield with which to  sleep tonight. The patient was also given drops with which to begin a drop regimen today and will follow-up with me in one day. Implant Name Type Inv. Item Serial No. Manufacturer Lot No. LRB No. Used Action  LENS IOL DIOP 24.5 - [id number] Intraocular Lens LENS IOL DIOP 24.5 [id number] AMO  Left 1 Implanted    Procedure(s) with comments: CATARACT EXTRACTION PHACO AND INTRAOCULAR LENS PLACEMENT (IOC) LEFT DIABETIC (Left) - 18.36 1:28.2  Electronically signed: Elliot Cousin 10/12/2019 8:27 AM

## 2019-10-13 ENCOUNTER — Encounter: Payer: Self-pay | Admitting: *Deleted

## 2022-03-26 ENCOUNTER — Emergency Department: Payer: Self-pay

## 2022-03-26 ENCOUNTER — Emergency Department
Admission: EM | Admit: 2022-03-26 | Discharge: 2022-03-26 | Disposition: A | Discharge status: Home / Self Care | Payer: Self-pay | Attending: Emergency Medicine | Admitting: Emergency Medicine

## 2022-03-26 ENCOUNTER — Other Ambulatory Visit: Payer: Self-pay

## 2022-03-26 ENCOUNTER — Encounter: Payer: Self-pay | Admitting: Emergency Medicine

## 2022-03-26 DIAGNOSIS — R109 Unspecified abdominal pain: Secondary | ICD-10-CM

## 2022-03-26 DIAGNOSIS — I10 Essential (primary) hypertension: Secondary | ICD-10-CM | POA: Insufficient documentation

## 2022-03-26 DIAGNOSIS — E119 Type 2 diabetes mellitus without complications: Secondary | ICD-10-CM | POA: Insufficient documentation

## 2022-03-26 DIAGNOSIS — K8020 Calculus of gallbladder without cholecystitis without obstruction: Secondary | ICD-10-CM | POA: Insufficient documentation

## 2022-03-26 DIAGNOSIS — Z794 Long term (current) use of insulin: Secondary | ICD-10-CM

## 2022-03-26 LAB — URINALYSIS, ROUTINE W REFLEX MICROSCOPIC
Bacteria, UA: NONE SEEN
Bilirubin Urine: NEGATIVE
Glucose, UA: >=500 mg/dL — AB
Hgb urine dipstick: NEGATIVE
Ketones, ur: NEGATIVE mg/dL
Nitrite: NEGATIVE
Protein, ur: NEGATIVE mg/dL
Specific Gravity, Urine: 1.015 (ref 1.005–1.030)
pH: 8 (ref 5.0–8.0)

## 2022-03-26 LAB — COMPREHENSIVE METABOLIC PANEL
ALT: 18 U/L (ref 0–44)
AST: 28 U/L (ref 15–41)
Albumin: 3.8 g/dL (ref 3.5–5.0)
Alkaline Phosphatase: 82 U/L (ref 38–126)
Anion gap: 15 (ref 5–15)
BUN: 16 mg/dL (ref 8–23)
CO2: 22 mmol/L (ref 22–32)
Calcium: 9.1 mg/dL (ref 8.9–10.3)
Chloride: 102 mmol/L (ref 98–111)
Creatinine, Ser: 0.58 mg/dL (ref 0.44–1.00)
GFR, Estimated: >60 mL/min (ref >60)
Glucose, Bld: 191 mg/dL — ABNORMAL HIGH (ref 70–99)
Potassium: 4.4 mmol/L (ref 3.5–5.1)
Sodium: 139 mmol/L (ref 135–145)
Total Bilirubin: 0.5 mg/dL (ref 0.3–1.2)
Total Protein: 8 g/dL (ref 6.5–8.1)

## 2022-03-26 LAB — CBC WITH DIFFERENTIAL/PLATELET
Abs Immature Granulocytes: 0.03 10*3/uL (ref 0.00–0.07)
Basophils Absolute: 0.1 10*3/uL (ref 0.0–0.1)
Basophils Relative: 1 %
Eosinophils Absolute: 0 10*3/uL (ref 0.0–0.5)
Eosinophils Relative: 0 %
HCT: 40.8 % (ref 36.0–46.0)
Hemoglobin: 12.4 g/dL (ref 12.0–15.0)
Immature Granulocytes: 0 %
Lymphocytes Relative: 12 %
Lymphs Abs: 1.2 10*3/uL (ref 0.7–4.0)
MCH: 24.1 pg — ABNORMAL LOW (ref 26.0–34.0)
MCHC: 30.4 g/dL (ref 30.0–36.0)
MCV: 79.4 fL — ABNORMAL LOW (ref 80.0–100.0)
Monocytes Absolute: 0.3 10*3/uL (ref 0.1–1.0)
Monocytes Relative: 3 %
Neutro Abs: 8.3 10*3/uL — ABNORMAL HIGH (ref 1.7–7.7)
Neutrophils Relative %: 84 %
Platelets: 233 10*3/uL (ref 150–400)
RBC: 5.14 MIL/uL — ABNORMAL HIGH (ref 3.87–5.11)
RDW: 16.3 % — ABNORMAL HIGH (ref 11.5–15.5)
WBC: 9.9 10*3/uL (ref 4.0–10.5)
nRBC: 0 % (ref 0.0–0.2)

## 2022-03-26 LAB — LIPASE, BLOOD: Lipase: 38 U/L (ref 11–51)

## 2022-03-26 MED ORDER — SODIUM CHLORIDE 0.9 % IV BOLUS
1000.0000 mL | Freq: Once | INTRAVENOUS | Status: AC
  Administered 2022-03-26: 1000 mL via INTRAVENOUS

## 2022-03-26 MED ORDER — ONDANSETRON HCL 4 MG/2ML IJ SOLN
4.0000 mg | Freq: Once | INTRAMUSCULAR | Status: AC
  Administered 2022-03-26: 4 mg via INTRAVENOUS
  Filled 2022-03-26: qty 2

## 2022-03-26 MED ORDER — NAPROXEN 500 MG PO TABS: 500.0000 mg | ORAL_TABLET | Freq: Two times a day (BID) | ORAL | 0 refills | Status: DC

## 2022-03-26 MED ORDER — PANTOPRAZOLE SODIUM 40 MG IV SOLR
40.0000 mg | Freq: Once | INTRAVENOUS | Status: AC
  Administered 2022-03-26: 40 mg via INTRAVENOUS
  Filled 2022-03-26: qty 10

## 2022-03-26 MED ORDER — FAMOTIDINE 20 MG PO TABS: 20.0000 mg | ORAL_TABLET | Freq: Two times a day (BID) | ORAL | 0 refills | Status: AC

## 2022-03-26 MED ORDER — ONDANSETRON 4 MG PO TBDP: 4.0000 mg | ORAL_TABLET | Freq: Three times a day (TID) | ORAL | 0 refills | Status: DC | PRN

## 2022-03-26 MED ORDER — KETOROLAC TROMETHAMINE 15 MG/ML IJ SOLN
15.0000 mg | Freq: Once | INTRAMUSCULAR | Status: AC
  Administered 2022-03-26: 15 mg via INTRAVENOUS
  Filled 2022-03-26: qty 1

## 2022-03-26 NOTE — ED Provider Notes (Signed)
[facility] Provider Note    Event Date/Time   First MD Initiated Contact with Patient 03/26/22 0700     (approximate)   History   Chief Complaint: Flank Pain   HPI  Juliann Olesky is a 71 y.o. female with a past history of hypertension, GERD, diabetes, obesity who comes the ED complaining of right flank pain radiating into the right lower quadrant for the past week.  Intermittent.  Worse with movement.  Associated with nausea.  No diarrhea or constipation.  No fever.  Reports eating normally this week.  No chest pain shortness of breath dizziness or body aches.     Physical Exam   Triage Vital Signs: ED Triage Vitals  Enc Vitals Group     BP 03/26/22 0618 (!) 152/56     Pulse Rate 03/26/22 0618 81     Resp 03/26/22 0618 18     Temp 03/26/22 0618 97.9 F (36.6 C)     Temp Source 03/26/22 0618 Oral     SpO2 03/26/22 0618 99 %     Weight 03/26/22 0613 220 lb (99.8 kg)     Height 03/26/22 0613 5\' 3"  (1.6 m)     Head Circumference --      Peak Flow --      Pain Score 03/26/22 0613 8     Pain Loc --      Pain Edu? --      Excl. in GC? --     Most recent vital signs: Vitals:   03/26/22 0618  BP: (!) 152/56  Pulse: 81  Resp: 18  Temp: 97.9 F (36.6 C)  SpO2: 99%    General: Awake, no distress.  CV:  Good peripheral perfusion.  Regular rate and rhythm, normal distal pulses Resp:  Normal effort.  Clear to auscultation bilaterally Abd:  No distention.  Soft with generalized tenderness, more pronounced on the right side of the abdomen.  Not peritoneal Other:  No lower extremity edema.  Moist oral mucosa.   ED Results / Procedures / Treatments   Labs (all labs ordered are listed, but only abnormal results are displayed) Labs Reviewed  CBC WITH DIFFERENTIAL/PLATELET - Abnormal; Notable for the following components:      Result Value   RBC 5.14 (*)    MCV 79.4 (*)    MCH 24.1 (*)    RDW 16.3 (*)    Neutro Abs 8.3 (*)     All other components within normal limits  COMPREHENSIVE METABOLIC PANEL - Abnormal; Notable for the following components:   Glucose, Bld 191 (*)    All other components within normal limits  URINALYSIS, ROUTINE W REFLEX MICROSCOPIC - Abnormal; Notable for the following components:   Color, Urine STRAW (*)    APPearance CLEAR (*)    Glucose, UA >=500 (*)    Leukocytes,Ua TRACE (*)    All other components within normal limits  LIPASE, BLOOD     EKG    RADIOLOGY CT abdomen pelvis interpreted by me, negative for cholelithiasis.  There is a gallstone, potentially at the gallbladder neck.  Radiology report reviewed.   PROCEDURES:  Procedures   MEDICATIONS ORDERED IN ED: Medications  sodium chloride 0.9 % bolus 1,000 mL (1,000 mLs Intravenous New Bag/Given 03/26/22 0824)  ondansetron (ZOFRAN) injection 4 mg (4 mg Intravenous Given 03/26/22 0808)  pantoprazole (PROTONIX) injection 40 mg (40 mg Intravenous Given 03/26/22 0824)  ketorolac (TORADOL) 15 MG/ML injection 15 mg (15 mg  Intravenous Given 03/26/22 0807)     IMPRESSION / MDM / ASSESSMENT AND PLAN / ED COURSE  I reviewed the triage vital signs and the nursing notes.                              Differential diagnosis includes, but is not limited to, cholecystitis, choledocholithiasis, appendicitis, ureterolithiasis, diverticulitis, viral illness, constipation, bowel obstruction, pancreatitis  Patient's presentation is most consistent with acute presentation with potential threat to life or bodily function.     Clinical Course as of 03/26/22 0928  Thu Mar 26, 2022  0719 Patient presents with right-sided abdominal pain for the past week, intermittent.  She has generalized tenderness but more pronounced on the right side of the abdomen.  CT abdomen pelvis interpreted by me, negative for any sign of ureterolithiasis or hydronephrosis.  There is a visible hyperdense stone obstructing the gallbladder neck.  We will  obtain an ultrasound.  We will give IV Toradol, Protonix, Zofran for symptom relief. [PS]    Clinical Course User Index [PS] Sharman Cheek, MD    ----------------------------------------- 9:28 AM on 03/26/2022 ----------------------------------------- Imaging shows no signs of cholecystitis or choledocholithiasis.  This appears to be biliary colic.  Labs are unremarkable.  Recommend follow-up with surgery clinic.  I do not think she requires urgent evaluation.  Abdomen is nonsurgical.   FINAL CLINICAL IMPRESSION(S) / ED DIAGNOSES   Final diagnoses:  Flank pain  Type 2 diabetes mellitus without complication, with long-term current use of insulin (HCC)  Calculus of gallbladder without cholecystitis without obstruction     Rx / DC Orders   ED Discharge Orders          Ordered    naproxen (NAPROSYN) 500 MG tablet  2 times daily with meals        03/26/22 0927    ondansetron (ZOFRAN-ODT) 4 MG disintegrating tablet  Every 8 hours PRN        03/26/22 0927    famotidine (PEPCID) 20 MG tablet  2 times daily        03/26/22 1975             Note:  This document was prepared using Dragon voice recognition software and may include unintentional dictation errors.   Sharman Cheek, MD 03/26/22 [phone]

## 2022-03-26 NOTE — ED Triage Notes (Signed)
Pt to triage via w/c with no distress noted; reports rt flank pain radiating into abd x wk accomp by nausea; denies hx of same

## 2022-03-26 NOTE — Discharge Instructions (Signed)
Your tests today reveal that you have a gallstone which may be causing your symptoms.  There are no signs of infection.  Your labs are all unremarkable.  Please follow-up with surgery for further evaluation.  Continue to see your doctor as well.

## 2022-03-30 ENCOUNTER — Telehealth: Payer: Self-pay

## 2022-03-30 NOTE — Telephone Encounter (Signed)
Error

## 2022-04-06 ENCOUNTER — Telehealth: Payer: Self-pay

## 2022-04-06 ENCOUNTER — Encounter: Payer: Self-pay | Admitting: Surgery

## 2022-04-06 ENCOUNTER — Other Ambulatory Visit: Payer: Self-pay

## 2022-04-06 ENCOUNTER — Ambulatory Visit (INDEPENDENT_AMBULATORY_CARE_PROVIDER_SITE_OTHER): Payer: Self-pay | Admitting: Surgery

## 2022-04-06 VITALS — BP 108/58 | HR 96 | Temp 98.4°F | Ht 60.0 in | Wt 198.0 lb

## 2022-04-06 DIAGNOSIS — K8020 Calculus of gallbladder without cholecystitis without obstruction: Secondary | ICD-10-CM

## 2022-04-06 NOTE — Progress Notes (Unsigned)
04/06/2022  Reason for Visit: Symptomatic cholelithiasis  History of Present Illness: Tara Salinas is a 71 y.o. female presenting for evaluation of symptomatic cholelithiasis.  The patient presented to emergency room on 03/26/2022 with a 1 to 2-week history of right upper quadrant abdominal pain associated with some nausea.  The patient reports that at first she thought the pain may have been related to activity but it continued without any improvement.  She had nausea but no emesis.  No fevers or chills, chest pain, or shortness of breath.  In the emergency room, she had workup including CT scan which showed cholelithiasis without any evidence of cholecystitis.  This was confirmed with ultrasound of the right upper quadrant which again showed cholelithiasis with a 6 mm stone in the region of the gallbladder neck but no pericholecystic fluid or wall thickening.  Her labs showed normal LFTs and normal white blood cell count.  Her pain improved in the emergency room and she has been doing better over the last 2 weeks.  Past Medical History: Past Medical History:  Diagnosis Date   Complication of anesthesia    Reports feeling of choking when she got an epidural.   COVID-19 12/26/2018   Diabetes mellitus without complication (HCC)    GERD (gastroesophageal reflux disease)    Hyperlipemia    Hypertension      Past Surgical History: Past Surgical History:  Procedure Laterality Date   CATARACT EXTRACTION W/PHACO Right 09/07/2019   Procedure: CATARACT EXTRACTION PHACO AND INTRAOCULAR LENS PLACEMENT (IOC) RIGHT DIABETIC VISION BLUE 18.56 01:35.2;  Surgeon: Elliot Cousin, MD;  Location: [facility];  Service: Ophthalmology;  Laterality: Right;   CATARACT EXTRACTION W/PHACO Left 10/12/2019   Procedure: CATARACT EXTRACTION PHACO AND INTRAOCULAR LENS PLACEMENT (IOC) LEFT DIABETIC;  Surgeon: Elliot Cousin, MD;  Location: [facility];  Service: Ophthalmology;  Laterality:  Left;  18.36 1:28.2   CESAREAN SECTION     TUBAL LIGATION      Home Medications: Prior to Admission medications   Medication Sig Start Date End Date Taking? Authorizing Provider  atorvastatin (LIPITOR) 40 MG tablet Take 40 mg by mouth daily.   Yes [provider]  Empagliflozin (JARDIANCE PO) Take by mouth.   Yes [provider]  famotidine (PEPCID) 20 MG tablet Take 1 tablet (20 mg total) by mouth 2 (two) times daily. 03/26/22  Yes Sharman Cheek, MD  losartan (COZAAR) 25 MG tablet Take 25 mg by mouth daily.   Yes [provider]  metFORMIN (GLUCOPHAGE) 1000 MG tablet Take 1,000 mg by mouth 2 (two) times daily with a meal.   Yes [provider]  pantoprazole (PROTONIX) 40 MG tablet Take 40 mg by mouth daily.   Yes [provider]  sertraline (ZOLOFT) 50 MG tablet Take 50 mg by mouth daily.   Yes [provider]    Allergies: Allergies  Allergen Reactions   Aspirin    Ibuprofen     Patient states she can take   Sulfamethoxazole-Trimethoprim    Oxycodone-Acetaminophen Other (See Comments)    Social History:  reports that she has never smoked. She has never used smokeless tobacco. She reports that she does not drink alcohol and does not use drugs.   Family History: History reviewed. No pertinent family history.  Review of Systems: Review of Systems  Constitutional:  Negative for chills and fever.  HENT:  Negative for hearing loss.   Respiratory:  Negative for shortness of breath.   Cardiovascular:  Negative for chest pain.  Gastrointestinal:  Positive for abdominal pain and nausea. Negative for constipation, diarrhea and vomiting.  Genitourinary:  Negative for dysuria.  Musculoskeletal:  Positive for back pain. Negative for myalgias.  Skin:  Negative for rash.  Neurological:  Negative for dizziness.  Psychiatric/Behavioral:  Negative for depression.     Physical Exam BP (!) 108/58   Pulse 96   Temp 98.4 F (36.9  C) (Oral)   Ht 5' (1.524 m)   Wt 198 lb (89.8 kg)   SpO2 95%   BMI 38.67 kg/m  CONSTITUTIONAL: No acute distress, well-nourished HEENT:  Normocephalic, atraumatic, extraocular motion intact. NECK: Trachea is midline, and there is no jugular venous distension.  RESPIRATORY:  Lungs are clear, and breath sounds are equal bilaterally. Normal respiratory effort without pathologic use of accessory muscles. CARDIOVASCULAR: Heart is regular without murmurs, gallops, or rubs. GI: The abdomen is soft, nondistended, nontender to palpation.  Negative Murphy's sign.  MUSCULOSKELETAL:  Normal muscle strength and tone in all four extremities.  No peripheral edema or cyanosis. SKIN: Skin turgor is normal. There are no pathologic skin lesions.  NEUROLOGIC:  Motor and sensation is grossly normal.  Cranial nerves are grossly intact. PSYCH:  Alert and oriented to person, place and time. Affect is normal.  Laboratory Analysis: Labs from 03/26/2022: Sodium 139, potassium 4.4, chloride 102, CO2 22, BUN 16, creatinine 0.58.  Total bilirubin 0.5, AST 28, ALT 18, alkaline phosphatase 82, lipase 38, albumin 3.8.  WBC 9.9, hemoglobin 12.4, hematocrit 40.8, platelets 233.  Imaging: CT renal stone on 03/26/2022: IMPRESSION: 1. No acute findings are noted in the abdomen or pelvis to account for the patient's symptoms. Specifically, no urinary tract calculi no findings of urinary tract obstruction. 2. Cholelithiasis without evidence of acute cholecystitis. 3. Aortic atherosclerosis, in addition to at least right coronary artery disease. Assessment for potential risk factor modification, dietary therapy or pharmacologic therapy may be warranted, if clinically indicated.  Ultrasound RUQ on 03/26/2022: IMPRESSION: Cholelithiasis with no sonographic evidence of acute cholecystitis.  Assessment and Plan: This is a 71 y.o. female with symptomatic cholelithiasis.  - Discussed with patient the findings on her imaging  studies as well as laboratory testing.  The patient currently feels better and has been doing well at home since her visit to the hospital.  Discussed with patient the potential options for conservative measures with low-fat diet versus surgical management.  The patient reports that her pain has been pretty significant and after discussing with her son who is present with her today, they decided to proceed with surgical management. - Discussed them with the patient the plan for a robotic assisted cholecystectomy and we reviewed the surgery at length with her including the incisions, risks of bleeding, infection, injury to surrounding structures, that this would be an outpatient procedure, the use of ICG for better evaluation of the biliary anatomy, postoperative pain control, activity restrictions, and she is willing to proceed. - We will send for medical clearance to her PCP.  Will also obtain new set of labs including hemoglobin A1c. - We will schedule surgery on 04/22/2022.  All her questions have been answered.  I spent 60 minutes dedicated to the care of this patient on the date of this encounter to include pre-visit review of records, face-to-face time with the patient discussing diagnosis and management, and any post-visit coordination of care.   Howie Ill, MD [facility]

## 2022-04-06 NOTE — H&P (View-Only) (Signed)
04/06/2022  Reason for Visit: Symptomatic cholelithiasis  History of Present Illness: Tara Salinas is a 71 y.o. female presenting for evaluation of symptomatic cholelithiasis.  The patient presented to emergency room on 03/26/2022 with a 1 to 2-week history of right upper quadrant abdominal pain associated with some nausea.  The patient reports that at first she thought the pain may have been related to activity but it continued without any improvement.  She had nausea but no emesis.  No fevers or chills, chest pain, or shortness of breath.  In the emergency room, she had workup including CT scan which showed cholelithiasis without any evidence of cholecystitis.  This was confirmed with ultrasound of the right upper quadrant which again showed cholelithiasis with a 6 mm stone in the region of the gallbladder neck but no pericholecystic fluid or wall thickening.  Her labs showed normal LFTs and normal white blood cell count.  Her pain improved in the emergency room and she has been doing better over the last 2 weeks.  Past Medical History: Past Medical History:  Diagnosis Date   Complication of anesthesia    Reports feeling of choking when she got an epidural.   COVID-19 12/26/2018   Diabetes mellitus without complication (HCC)    GERD (gastroesophageal reflux disease)    Hyperlipemia    Hypertension      Past Surgical History: Past Surgical History:  Procedure Laterality Date   CATARACT EXTRACTION W/PHACO Right 09/07/2019   Procedure: CATARACT EXTRACTION PHACO AND INTRAOCULAR LENS PLACEMENT (IOC) RIGHT DIABETIC VISION BLUE 18.56 01:35.2;  Surgeon: Elliot Cousin, MD;  Location: [facility];  Service: Ophthalmology;  Laterality: Right;   CATARACT EXTRACTION W/PHACO Left 10/12/2019   Procedure: CATARACT EXTRACTION PHACO AND INTRAOCULAR LENS PLACEMENT (IOC) LEFT DIABETIC;  Surgeon: Elliot Cousin, MD;  Location: [facility];  Service: Ophthalmology;  Laterality:  Left;  18.36 1:28.2   CESAREAN SECTION     TUBAL LIGATION      Home Medications: Prior to Admission medications   Medication Sig Start Date End Date Taking? Authorizing Provider  atorvastatin (LIPITOR) 40 MG tablet Take 40 mg by mouth daily.   Yes [provider]  Empagliflozin (JARDIANCE PO) Take by mouth.   Yes [provider]  famotidine (PEPCID) 20 MG tablet Take 1 tablet (20 mg total) by mouth 2 (two) times daily. 03/26/22  Yes Sharman Cheek, MD  losartan (COZAAR) 25 MG tablet Take 25 mg by mouth daily.   Yes [provider]  metFORMIN (GLUCOPHAGE) 1000 MG tablet Take 1,000 mg by mouth 2 (two) times daily with a meal.   Yes [provider]  pantoprazole (PROTONIX) 40 MG tablet Take 40 mg by mouth daily.   Yes [provider]  sertraline (ZOLOFT) 50 MG tablet Take 50 mg by mouth daily.   Yes [provider]    Allergies: Allergies  Allergen Reactions   Aspirin    Ibuprofen     Patient states she can take   Sulfamethoxazole-Trimethoprim    Oxycodone-Acetaminophen Other (See Comments)    Social History:  reports that she has never smoked. She has never used smokeless tobacco. She reports that she does not drink alcohol and does not use drugs.   Family History: History reviewed. No pertinent family history.  Review of Systems: Review of Systems  Constitutional:  Negative for chills and fever.  HENT:  Negative for hearing loss.   Respiratory:  Negative for shortness of breath.   Cardiovascular:  Negative for chest pain.  Gastrointestinal:  Positive for abdominal pain and nausea. Negative for constipation, diarrhea and vomiting.  Genitourinary:  Negative for dysuria.  Musculoskeletal:  Positive for back pain. Negative for myalgias.  Skin:  Negative for rash.  Neurological:  Negative for dizziness.  Psychiatric/Behavioral:  Negative for depression.     Physical Exam BP (!) 108/58   Pulse 96   Temp 98.4 F (36.9  C) (Oral)   Ht 5' (1.524 m)   Wt 198 lb (89.8 kg)   SpO2 95%   BMI 38.67 kg/m  CONSTITUTIONAL: No acute distress, well-nourished HEENT:  Normocephalic, atraumatic, extraocular motion intact. NECK: Trachea is midline, and there is no jugular venous distension.  RESPIRATORY:  Lungs are clear, and breath sounds are equal bilaterally. Normal respiratory effort without pathologic use of accessory muscles. CARDIOVASCULAR: Heart is regular without murmurs, gallops, or rubs. GI: The abdomen is soft, nondistended, nontender to palpation.  Negative Murphy's sign.  MUSCULOSKELETAL:  Normal muscle strength and tone in all four extremities.  No peripheral edema or cyanosis. SKIN: Skin turgor is normal. There are no pathologic skin lesions.  NEUROLOGIC:  Motor and sensation is grossly normal.  Cranial nerves are grossly intact. PSYCH:  Alert and oriented to person, place and time. Affect is normal.  Laboratory Analysis: Labs from 03/26/2022: Sodium 139, potassium 4.4, chloride 102, CO2 22, BUN 16, creatinine 0.58.  Total bilirubin 0.5, AST 28, ALT 18, alkaline phosphatase 82, lipase 38, albumin 3.8.  WBC 9.9, hemoglobin 12.4, hematocrit 40.8, platelets 233.  Imaging: CT renal stone on 03/26/2022: IMPRESSION: 1. No acute findings are noted in the abdomen or pelvis to account for the patient's symptoms. Specifically, no urinary tract calculi no findings of urinary tract obstruction. 2. Cholelithiasis without evidence of acute cholecystitis. 3. Aortic atherosclerosis, in addition to at least right coronary artery disease. Assessment for potential risk factor modification, dietary therapy or pharmacologic therapy may be warranted, if clinically indicated.  Ultrasound RUQ on 03/26/2022: IMPRESSION: Cholelithiasis with no sonographic evidence of acute cholecystitis.  Assessment and Plan: This is a 71 y.o. female with symptomatic cholelithiasis.  - Discussed with patient the findings on her imaging  studies as well as laboratory testing.  The patient currently feels better and has been doing well at home since her visit to the hospital.  Discussed with patient the potential options for conservative measures with low-fat diet versus surgical management.  The patient reports that her pain has been pretty significant and after discussing with her son who is present with her today, they decided to proceed with surgical management. - Discussed them with the patient the plan for a robotic assisted cholecystectomy and we reviewed the surgery at length with her including the incisions, risks of bleeding, infection, injury to surrounding structures, that this would be an outpatient procedure, the use of ICG for better evaluation of the biliary anatomy, postoperative pain control, activity restrictions, and she is willing to proceed. - We will send for medical clearance to her PCP.  Will also obtain new set of labs including hemoglobin A1c. - We will schedule surgery on 04/22/2022.  All her questions have been answered.  I spent 60 minutes dedicated to the care of this patient on the date of this encounter to include pre-visit review of records, face-to-face time with the patient discussing diagnosis and management, and any post-visit coordination of care.   Howie Ill, MD [facility]

## 2022-04-06 NOTE — Patient Instructions (Addendum)
Medical Clearance faxed to Dr[facility]Revel[facility] @ [phone].   Our surgery scheduler will call you within 24-48 hours to schedule your surgery. Please have the Pink surgery sheet available when speaking with her.    Colecistectoma mnimamente invasiva Minimally Invasive Cholecystectomy Una colecistectoma mnimamente invasiva es una ciruga que se realiza para extirpar la vescula biliar. La vescula biliar es un rgano que tiene forma de pera y se encuentra debajo del hgado, del lado derecho del cuerpo. La vescula biliar almacena bilis, un lquido que ayuda al organismo a digerir las grasas. La colecistectoma se realiza con frecuencia para tratar la inflamacin (irritacin e hinchazn) de la vescula biliar (colecistitis). Por lo general, esta afeccin se debe a una acumulacin de clculos biliares (colelitiasis) en la vescula biliar o al estancamiento del lquido de la vescula biliar a causa de que los clculos biliares se atascan en los conductos (tubos) y obstruyen el paso de la bilis. Esto puede producir inflamacin y Engineer, mining. En los [employer], podr ser [place]. Este procedimiento se realiza a travs de pequeas incisiones en el abdomen, en lugar de una incisin grande. Tambin se denomina "ciruga laparoscpica". Se introduce un endoscopio delgado que tiene Secretary/administrator (laparoscopio) a travs de una incisin. A travs de las otras incisiones, se introducen los instrumentos quirrgicos. En algunos casos, es posible que [place] mnimamente invasiva deba cambiarse a una [place] realizada a travs de una incisin ms grande. Esta se denomina "ciruga abierta". Informe al mdico acerca de lo siguiente: Cualquier alergia que tenga. Todos los [employer] [place], incluidos vitaminas, hierbas, gotas oftlmicas, cremas y [address] de [address]. Problemas previos que usted o algn miembro de su familia hayan tenido con los anestsicos. Cualquier problema de la  sangre que tenga. Cirugas a las que se haya sometido. Cualquier afeccin mdica que tenga. Si est embarazada o podra estarlo. Cules son los riesgos? En general, se trata de un procedimiento seguro. Sin embargo, pueden ocurrir complicaciones, por ejemplo: Infeccin. Sangrado. Reacciones alrgicas a los medicamentos. Daos a las estructuras o los rganos cercanos. Un clculo biliar que queda en el conducto biliar comn. El conducto coldoco transporta la bilis desde la vescula biliar hasta el intestino delgado. Una filtracin de bilis del hgado o del conducto qustico despus de que se extirpa la vescula biliar. Qu ocurre antes del procedimiento? Cundo dejar de comer y beber Siga las instrucciones del mdico con respecto a lo que puede comer y beber antes del procedimiento. Pueden incluir: Ocho horas antes del procedimiento Deje de comer la mayora de los alimentos. No coma carne, alimentos fritos ni alimentos grasos. Consuma solo alimentos livianos, como tostadas o Social worker. Todos los lquidos son aceptables, excepto las bebidas energticas y el alcohol. Seis horas antes del procedimiento Deje de comer. Beba nicamente lquidos transparentes, como agua, jugo de fruta transparente, caf solo, t solo y bebidas deportivas. No consuma bebidas energticas ni alcohol. Dos horas antes del procedimiento Deje de beber todos los lquidos. Es posible que le permitan tomar medicamentos con pequeos sorbos de [place]. Si no sigue las instrucciones del mdico, el procedimiento puede retrasarse o cancelarse. Medicamentos Consulte al mdico si debe hacer o no lo siguiente: Multimedia programmer o suspender los medicamentos que [place] habitualmente. Esto es muy importante si toma medicamentos para la diabetes o anticoagulantes. Tomar medicamentos como aspirina e ibuprofeno. Estos medicamentos pueden tener un efecto anticoagulante en la [place]. No tome estos medicamentos a menos que el mdico se lo  indique. Usar medicamentos de  venta libre, vitaminas, hierbas y suplementos. Instrucciones generales Si va a marcharse a su casa inmediatamente despus del procedimiento, pdale a un adulto responsable que: Lo lleve a su casa desde el hospital o la clnica. No se le permitir conducir. Lo cuide durante el [employer] indiquen. No consuma ningn producto que contenga nicotina ni tabaco durante al [employer] las 4 semanas anteriores al procedimiento. Estos productos incluyen cigarrillos, tabaco para Theatre manager y aparatos de vapeo, como los Administrator, Civil Service. Si necesita ayuda para dejar de fumar, consulte al [employer]. Pregntele al mdico: Cmo se Forensic psychologist de la Leisure centre manager. Qu medidas se tomarn para evitar una infeccin. Pueden incluir: Rasurar el vello del lugar de la ciruga. Lavar la piel con un jabn antisptico. Recibir antibiticos. Qu ocurre durante el procedimiento?  Le colocarn una va intravenosa (i.v.) en una vena. Le administrarn uno de los siguientes medicamentos o ambos: Un medicamento para ayudar a Lexicographer (sedante). Un medicamento que lo har dormir (anestesia general). Su cirujano le har varias incisiones pequeas en el abdomen. El laparoscopio se introducir a travs de una de las pequeas incisiones. La cmara del laparoscopio enviar imgenes a un monitor que se encuentra en el quirfano. Esto permitir a su Pensions consultant del abdomen. Le inyectarn un gas en el abdomen. Esto expandir el abdomen para que el cirujano tenga ms lugar para Facilities manager. El resto del instrumental necesario para el procedimiento se introducir a travs de las otras incisiones. Se extirpar la vescula biliar a travs de una de las incisiones. Se puede examinar el conducto coldoco. Si se encuentran clculos en la va biliar, tal vez deban extirparse. Despus de la extirpacin de la vescula biliar, se cerrarn las incisiones con puntos (suturas), grapas o goma para cerrar la  piel. Las incisiones pueden cubrirse con una venda (vendaje). El procedimiento puede variar segn el mdico y el hospital. Ladell Heads ocurre despus del procedimiento? Le controlarn la presin arterial, la frecuencia cardaca, la frecuencia respiratoria y Air cabin crew de oxgeno en la sangre hasta que le den el alta del hospital o la clnica. Le darn analgsicos para Human resources officer, si es necesario. Es posible que le coloquen un drenaje en la incisin. Se lo retirarn [employer] despus del procedimiento. Resumen La colecistectoma mnimamente invasiva, tambin llamada colecistectoma laparoscpica, es una ciruga que se realiza para extirpar la vescula biliar a travs de pequeas incisiones. Informe a su mdico sobre todas las otras afecciones que tenga y [place] todos los medicamentos que est usando para dichas afecciones. Antes del procedimiento, siga las instrucciones sobre cundo dejar de comer y beber y Tacy Dura cambiar o suspender medicamentos. Haga que un adulto responsable lo cuide durante el tiempo que le indiquen despus de que le den el alta del hospital o de la clnica. Esta informacin no tiene Theme park manager el consejo del mdico. Asegrese de hacerle al mdico cualquier pregunta que tenga. Document Revised: 11/13/2020 Document Reviewed: 11/13/2020 Elsevier Patient Education  2023 Elsevier Inc. Colelitiasis Cholelithiasis  La colelitiasis es una enfermedad en la que se forman clculos biliares en la vescula biliar. La vescula biliar es un rgano que almacena bilis. La bilis es un lquido que interviene en la digestin de las [place]. Los clculos comienzan como pequeos cristales y lentamente pueden transformarse en piedras. Es posible que no causen sntomas hasta que obstruyen el conducto de la vescula biliar o el conducto qustico, cuando la vescula biliar se tensa (contrae) despus de comer alimentos. Esto puede causar  dolor y se conoce como ataque de vescula biliar o  clico biliar. Hay dos tipos principales de clculos biliares: Clculos de colesterol. Estos son el tipo de clculo biliar ms frecuente. Estos clculos se forman por el colesterol endurecido y generalmente son de color amarillo verdoso. El colesterol es una sustancia parecida a la grasa que se forma en el hgado. Clculos de pigmento. Estos son de color oscuro y estn formados por una sustancia amarillo rojiza, llamada bilirrubina,que se forma cuando la hemoglobina de los glbulos rojos se descompone. Cules son las causas? La causa de esta afeccin puede ser un desequilibrio en las diferentes partes que producen la bilis. Esto puede suceder si la bilis: Tiene mucha bilirrubina. Esto puede suceder en ciertas enfermedades de la sangre, como la anemia drepanoctica. Tiene mucho colesterol. No tiene suficiente sales biliares. Estas sales le ayudan al organismo a Environmental health practitioner y a Mining engineer. En ciertos casos, esta afeccin tambin puede ser causada por una vescula biliar que no se vaca completamente o lo suficientemente seguido. Esto es frecuente Academic librarian. Qu incrementa el riesgo? Los siguientes factores pueden hacer que sea ms propenso a Clinical cytogeneticist afeccin: Ser mujer. Tener embarazos mltiples. Algunas veces los mdicos aconsejan extirpar los clculos biliares antes de futuros embarazos. Tener una dieta con demasiadas comidas fritas, grasas y carbohidratos refinados, como el pan blanco y el arroz blanco. Ser obeso. Ser mayor de 40 aos de edad. Usar medicamentos que contienen hormonas femeninas (estrgenos) durante un Psychologist, forensic. Perder peso con rapidez. Antecedentes familiares de clculos biliares. Tener ciertos problemas mdicos, por ejemplo: Diabetes mellitus. Fibrosis qustica. Enfermedad de Crohn. Cirrosis u otra enfermedad heptica a largo plazo (crnica). Determinadas enfermedades de la sangre, como anemia drepanoctica o leucemia. Cules son los signos o  sntomas? En muchos casos, tener clculos biliares no causa sntomas. Si tiene clculos biliares pero no tiene sntomas, tiene clculos silenciosos. Si un clculo biliar bloquea el conducto biliar, puede causar un ataque de vescula biliar. El principal sntoma de un ataque de vescula biliar es un dolor repentino en la parte superior derecha del abdomen. El dolor: Aparece generalmente a la noche o despus de comer comidas abundantes. Puede durar Neomia Dear hora o ms. Se puede extender hacia el hombro derecho, la espalda o el pecho. Puede sentirse como indigestin. Esto es [place], ardor o sensacin de plenitud en la parte superior del abdomen. Si el conducto biliar est bloqueado durante ms de algunas horas, puede causar infeccin o inflamacin de la vescula biliar (colecistitis), del hgado o del pncreas. Esto puede causar lo siguiente: Nuseas o vmitos. Distensin. Dolor abdominal que dura 5 horas o ms. Dolor con la palpacin en la parte superior del abdomen, a menudo en la seccin superior derecha y debajo de la caja torcica. Fiebre o escalofros. La piel o las partes blancas de los ojos se ponen amarillas (ictericia). En general esto ocurre cuando una piedra ha obstruido el paso de la bilis a travs del conducto biliar comn. Orina de color oscuro o heces de color plido. Cmo se diagnostica? Esta afeccin se puede diagnosticar en funcin de lo siguiente: Un examen fsico. Sus antecedentes mdicos. Una ecografa. Exploracin por tomografa computarizada (TC). Resonancia magntica (RM). Tambin pueden hacerle otras pruebas, incluidas las siguientes: Anlisis de sangre para Engineer, manufacturing signos de infeccin o inflamacin. Una colecistografa o gammagrafa hepatobiliar HIDA. Este es un estudio de la vescula biliar y los conductos biliares (sistema biliar) que Nurse, mental health material radiactivo no daino y Therapist, occupational que pueden ver  el Careers information officermaterial radiactivo. Colangiopancreatografa  retrgrada endoscpica. Este Sonic Automotiveestudio consiste en insertar un pequeo tubo con una cmara en el extremo (endoscopio) a travs de la boca para observar los conductos biliares y Engineer, manufacturingdetectar obstrucciones. Cmo se trata? El tratamiento de esta afeccin depende de la gravedad. Los clculos silenciosos no requieren TEFL teachertratamiento. Si una obstruccin causa un ataque de vescula biliar u otros sntomas, puede ser necesario Veterinary surgeonrecibir tratamiento. El tratamiento puede incluir: Cuidados en el hogar, si los sntomas no son graves. Durante un ataque simple de vescula biliar, deje de comer y beber durante 12 a 24 horas (excepto agua y lquidos transparentes). Esto ayuda a "enfriar" la vescula biliar. Despus de 1 o 2 das, puede comenzar a consumir alimentos simples o transparentes, como caldos y Gaffergalletas. Tambin es posible que necesite medicamentos para el dolor o las nuseas, o para ambos. Si tiene colecistitis y [place] infeccin, Pension scheme managernecesitar antibiticos. Hospitalizacin, si es necesaria para controlar del dolor o en caso de colecistitis con infeccin grave. Una colecistectoma, o ciruga para extirpar la vescula biliar. Este es el tratamiento ms frecuente si todos los dems tratamientos no han funcionado. Medicamentos para destruir los clculos biliares. Estos son ms efectivos para tratar clculos pequeos. Los medicamentos pueden usarse durante hasta 6 a 12 meses. Colangiopancreatografa retrgrada endoscpica. Se puede anexar una pequea cesta al endoscopio y usarse para capturar y eliminar los clculos, especialmente los que se encuentran en el conducto biliar comn. Siga estas instrucciones en su casa: Medicamentos Use los medicamentos de venta libre y los recetados solamente como se lo haya indicado el mdico. Si le recetaron un antibitico, tmelo como se lo haya indicado el mdico. No deje de tomar el antibitico aunque comience a sentirse mejor. Pregntele al mdico si el medicamento recetado le impide  conducir o usar Uruguaymaquinaria. Comida y bebida Beba suficiente lquido como para Pharmacologistmantener la orina de color amarillo plido. Esto es importante durante un ataque de vescula biliar. Es preferible tomar agua y lquidos claros. Siga una dieta saludable. Esto puede comprender lo siguiente: Reducir los Huntsman Corporationalimentos grasos, como los alimentos fritos y los alimentos con alto contenido de Oncologistcolesterol. Reducir los carbohidratos refinados, como el pan blanco y el arroz blanco. Consumir ms [place]-Devils Lakefibra. Alimentarse con alimentos como almendras, frutas y frijoles. Consumo de alcohol Si bebe alcohol: Limite la cantidad que bebe: De 0 a 1 medida por da para las mujeres que no estn embarazadas. De 0 a 2 medidas por da para los hombres. Est atento a la cantidad de alcohol que hay en las bebidas que toma. En los [address] [place] [address], una medida equivale a una botella de cerveza de 12 oz (355 ml), un vaso de vino de 5 oz (148 ml) o un vaso de una bebida alcohlica de alta graduacin de 1 oz (44 ml). Instrucciones generales No consuma ningn producto que contenga nicotina o tabaco, como cigarrillos, cigarrillos electrnicos y tabaco de Theatre managermascar. Si necesita ayuda para dejar de fumar, consulte al mdico. Mantenga un peso saludable. Concurra a todas las visitas de seguimiento como se lo haya indicado el mdico. Estas pueden incluir consultas con un cirujano o un especialista. Esto es importante. Dnde buscar ms informacin General Millsational Institute of Diabetes and Digestive and Kidney Diseases [employer](Instituto Nacional de la Diabetes y las Enfermedades Digestivas y Renales): CarFlippers.tnwww.niddk.nih.gov Comunquese con un mdico si: Piensa que ha tenido un ataque de vescula biliar. Le han diagnosticado clculos silenciosos y Licensed conveyancertiene dolor en el abdomen o indigestin. Comienza a tener ataques con ms frecuencia. Orina de color oscuro  o tiene heces de color plido. Solicite ayuda de inmediato si: Tiene dolor por un ataque de vescula biliar que dura  ms de 2 horas. Tiene dolor en el abdomen que dura ms de 5 horas o est empeorando. Tiene fiebre o escalofros. Tiene nuseas y vmitos que no desaparecen. Tiene ictericia. Resumen La colelitiasis es una enfermedad en la que se forman clculos biliares en la vescula biliar. La causa de esta afeccin puede ser un desequilibrio en las diferentes partes que producen la bilis. Esto puede suceder si la bilis tiene demasiado colesterol o bilirrubina, o contiene cantidad insuficiente de sales biliares. El tratamiento de los clculos biliares depende de la gravedad de la afeccin. Los clculos silenciosos no requieren TEFL teacher. Si los clculos causan un ataque de vescula biliar u otros sntomas, el tratamiento generalmente implica no comer ni beber nada. El tratamiento tambin puede incluir analgsicos y antibiticos, y a Biomedical scientist. La ciruga para extirpar la vescula biliar es frecuente si todos los dems tratamientos no han funcionado. Esta informacin no tiene Theme park manager el consejo del mdico. Asegrese de hacerle al mdico cualquier pregunta que tenga. Document Revised: 06/02/2019 Document Reviewed: 06/02/2019 Elsevier Patient Education  2023 [employer].

## 2022-04-06 NOTE — Telephone Encounter (Signed)
Medical Clearance faxed to Dr.Revelo Mancheno.

## 2022-04-07 ENCOUNTER — Telehealth: Payer: Self-pay | Admitting: Surgery

## 2022-04-07 NOTE — Telephone Encounter (Signed)
Spoke with son, [place].  They are informed of information regarding surgery with Dr. Aleen Campi.  Patient has been advised of Pre-Admission date/time, and Surgery date at [facility].  Surgery Date: 04/22/22 Preadmission Testing Date: 04/14/22 (phone 1p-5p)  Patient has been made aware to call [phone], between 1-3:00pm the day before surgery, to find out what time to arrive for surgery.

## 2022-04-14 ENCOUNTER — Encounter
Admission: RE | Admit: 2022-04-14 | Discharge: 2022-04-14 | Disposition: A | Discharge status: Home / Self Care | Payer: Self-pay | Source: Ambulatory Visit | Attending: Surgery | Admitting: Surgery

## 2022-04-14 VITALS — Ht 60.0 in | Wt 198.0 lb

## 2022-04-14 DIAGNOSIS — Z794 Long term (current) use of insulin: Secondary | ICD-10-CM

## 2022-04-14 DIAGNOSIS — I10 Essential (primary) hypertension: Secondary | ICD-10-CM

## 2022-04-14 HISTORY — DX: Depression, unspecified: F32.A

## 2022-04-14 NOTE — Patient Instructions (Addendum)
Your procedure is scheduled on: Wednesday April 22, 2022. Su procedimiento est programado para: Miercoles 13 de Diciembre del 2023. Report to Day Surgery inside [facility] 2nd floor, stop by registration before getting on elevator.  Presntese Tara: Diplomatic Services operational [place] del [facility] 2do piso, registrese primero en Financial risk analyst excritorio Tara su derecha al Tara [place]. To find out your arrival time please call [phone] between 1PM - 3PM on Tuesday April 21, 2022. Para saber su hora de llegada por favor llame al [phone] entre la 1PM - 3PM el da: Martes 12 de Diciembre del 2023.  Remember: Instructions that are not followed completely may result in serious medical risk, up to and including death,  or upon the discretion of your surgeon and anesthesiologist your surgery may need to be rescheduled.  Recuerde: Las instrucciones que no se siguen completamente Tara [place] en un riesgo de salud grave, incluyendo hasta  la [place] o Tara discrecin de su cirujano y Scientific laboratory technician, su ciruga se puede posponer.   __X_ 1.Do not eat food after midnight the night before your procedure. No    gum chewing or hard candies. You may drink clear liquids up to 2 hours     before you are scheduled to arrive for your surgery- DO not drink clear     Liquids within 2 hours of the start of your surgery.     Clear Liquids include:    [place],       No coma nada despus de la medianoche de la noche anterior Tara su    procedimiento. No coma chicles ni caramelos duros. Puede tomar    lquidos claros hasta 2 horas antes de su hora programada de llegada al     [place] para su procedimiento. No tome lquidos claros durante el     transcurso de las 2 horas de su llegada programada al [place] para su     procedimiento, ya que esto puede llevar Tara que su procedimiento se    retrase o tenga que volver Tara Magazine features editor.  Los lquidos claros incluyen:          Tara [place]               _X__ 2.Do Not Smoke or use  e-cigarettes For 24 Hours Prior to Your Surgery.    Do not use any chewable tobacco products for at least 6   hours prior to surgery.    No fume ni use cigarrillos electrnicos durante las 24 horas previas    Tara su [place].  No use ningn producto de tabaco masticable durante   al menos 6 horas antes de la [place].     __X_ 3. No alcohol for 24 hours before or after surgery.    No tome alcohol durante las 24 horas antes ni despus de la [place].   __X__4. On the morning of surgery brush your teeth with toothpaste and [place], you                may rinse your mouth with mouthwash if you wish.  Do not swallow any toothpaste of mouthwash.   En la maana de la [place], cepllese los dientes con pasta de dientes y Tara [place],                Tara [place] enjuagarse la boca con enjuague bucal si lo desea. No ingiera ninguna pasta de dientes o enjuague bucal.   __X__ 6. Notify your doctor if there is any change in your medical condition (cold,fever,  infections).    Informe Tara su mdico si hay algn cambio en su condicin mdica  (resfriado, fiebre, infecciones).   Do not wear jewelry, make-up, hairpins, clips or nail polish.  No use joyas, maquillajes, pinzas/ganchos para el cabello ni esmalte de uas.  Do not wear lotions, powders, or perfumes. You may wear deodorant.  No use lociones, polvos o perfumes.  Puede usar desodorante.    Do not shave 48 hours prior to surgery. Men may shave face and neck.  No se afeite 48 horas antes de la [place].  Los hombres pueden [employer] cara  y el cuello.   Do not bring valuables to the [place].   No lleve objetos de valor al [place].  Tara [place] is not responsible for any belongings or valuables.  Tara [place] no se hace responsable de ningn tipo de pertenencias u objetos de Tara [place].               Contacts, dentures or bridgework may not be worn into surgery.  Los lentes de Tara [place], las dentaduras postizas o puentes no se pueden usar en la [place].   Leave your  suitcase in the car. After surgery it may be brought to your room.  Deje su maleta en el auto.  Despus de la ciruga podr traerla Tara su habitacin.   For patients admitted to the [place], discharge time is determined by your  treatment team.  Para los pacientes que sean ingresados al [place], el tiempo en el cual se le  dar de alta es determinado por su equipo de Tara [place].   Patients discharged the day of surgery will not be allowed to drive home. Tara los pacientes que se les da de alta el mismo da de la ciruga no se les permitir conducir Tara Tara [place].   __X__ Take these medicines the morning of surgery with Tara [place]:          Tara [place] estas medicinas la maana de la ciruga con UN SORBO DE AGUA:  1. pantoprazole (PROTONIX) 40 MG   2. sertraline (ZOLOFT) 50 MG   3.   4.       5.  6.  ____ Fleet Enema (as directed)          Enema de Fleet (segn lo indicado)    __X__ Use CHG Soap as directed          Utilice el jabn de CHG segn lo indicado  ____ Use inhalers on the day of surgery          Use los inhaladores el da de la ciruga  ____ Stop Empagliflozin (JARDIANCE) 3 days prior to surgery (take last dose Saturday 04/18/22)  Deje de tomar Empagliflozin (JARDIANCE) 3 dias antes de la [place] (tome su ultima dosis el Sabadao 9 de Diciembre)  __X__ Stop metformin 2 days prior to surgery (take last dose Sunday 04/19/22)          Deje de tomar el metformin 2 das antes de la ciruga  ([place] su ultima dosis el Domingo 10 de [place])   __X__ Take insulin as usual insulin dose the night before surgery and none on the morning of surgery   insulin NPH-regular Human (70-30) 100 UNIT/ML injection           Inyectese la dosis habitual de insulina la noche antes de la [place] y no se inyecte insulina la maana de la ciruga  __X__ Stop Anti-inflammatories such as Ibuprofen, Aleve, Advil, Motrin, Naprosyn, Meloxicam, Lodine,  Ketoralac, Midol, and aspirin containing products like  Excedrin, Goody's and or BC Powders.          Deje de tomar antiinflamatorios como Ibuprofen, Aleve, Advil, Motrin, Naprosyn, Meloxicam, Lodine, Ketoralac, Midol, o productos con aspirina como Excedrin, Goody's and or BC Powders.   __X__ Stop supplements until after surgery            Deje de tomar suplementos hasta despus de la ciruga  ____ Bring C-Pap to the [place]          Lleve el C-Pap al [place]     If you have any questions regarding your pre-procedure instructions,  Please call Pre-admit Testing at [phone]  Si tiene alguna pregunta con respecto Tara las instrucciones previas al procedimiento, Llame Tara [place] previas Tara la admisin al [phone]    Preparacin para la ciruga con jabn de GLUCONATO DE CLORHEXIDINA (CHG)  Jabn de gluconato de clorhexidina (CHG)  o Un limpiador antisptico que [employer] grmenes y se adhiere Tara la piel para seguir [employer] grmenes incluso despus del lavado.  o Se utiliza para ducharse la noche anterior Tara la [place] y la maana de la [place].  Antes de la [place], usted puede desempear un papel importante al reducir la cantidad de grmenes en su piel. El jabn CHG (gluconato de clorhexidina) es un limpiador antisptico que mata los grmenes y se adhiere Tara la piel para continuar matndolos incluso despus del lavado.  No lo utilice si es alrgico al CHG o Tara los jabones antibacterianos. Si su piel se enrojece o irrita, deje de usar CHG.  1. Ducharse la NOCHE ANTES DE LA CIRUGA y la [place] DE LA CIRUGA con jabn CHG.  2. Si eliges lavarte el cabello, lvalo primero como de costumbre con tu champ habitual.  3. Despus del champ, enjuague bien el cabello y el cuerpo para eliminar el champ.  4. Utilice CHG como lo hara con cualquier otro jabn lquido. Puede aplicar CHG directamente sobre la piel y lavar suavemente con un pauelo o una toallita limpia.  5. Aplique el jabn CHG en su cuerpo nicamente desde el cuello hacia  abajo. No utilizar en heridas abiertas o llagas abiertas. Evite el contacto con los ojos, odos, boca y genitales (partes privadas). Lvese la cara y los genitales (partes privadas) con su jabn habitual.  6. Lvese bien, prestando especial atencin al rea donde se realizar su ciruga.  7. Enjuague bien su cuerpo con agua tibia.  8. No se duche ni se lave con su jabn normal despus de usar y enjuagar el jabn CHG.  9. Squese dando palmaditas con una toalla limpia.  10. Use pijamas limpios para dormir la noche anterior Tara la ciruga.  12. Coloque sbanas limpias en su cama la noche de su primera ducha y no duerma con mascotas.  13. Ducharse nuevamente con el jabn CHG el da de la ciruga antes de llegar al [place].  14. No aplique desodorantes, lociones o polvos.  15. Por favor use ropa limpia al [place].

## 2022-04-16 ENCOUNTER — Inpatient Hospital Stay: Admission: RE | Admit: 2022-04-16 | Payer: Self-pay | Source: Ambulatory Visit

## 2022-04-16 ENCOUNTER — Encounter
Admission: RE | Admit: 2022-04-16 | Discharge: 2022-04-16 | Disposition: A | Discharge status: Home / Self Care | Payer: Self-pay | Source: Ambulatory Visit | Attending: Surgery | Admitting: Surgery

## 2022-04-16 DIAGNOSIS — I10 Essential (primary) hypertension: Secondary | ICD-10-CM | POA: Insufficient documentation

## 2022-04-16 DIAGNOSIS — Z01818 Encounter for other preprocedural examination: Secondary | ICD-10-CM | POA: Insufficient documentation

## 2022-04-16 DIAGNOSIS — K8020 Calculus of gallbladder without cholecystitis without obstruction: Secondary | ICD-10-CM | POA: Insufficient documentation

## 2022-04-17 LAB — HEMOGLOBIN A1C
Hgb A1c MFr Bld: 8 % — ABNORMAL HIGH (ref 4.8–5.6)
Mean Plasma Glucose: 183 mg/dL

## 2022-04-21 ENCOUNTER — Telehealth: Payer: Self-pay

## 2022-04-21 NOTE — Telephone Encounter (Signed)
Spoke with [facility] nurse and she has tried reaching patient to make an appointment for further A1C testing but unable to reach at this time due to number disconnected.

## 2022-04-21 NOTE — Progress Notes (Unsigned)
Medical Clearance received from Dr Ephraim Hamburger. The patient is cleared at Medium risk for surgery. Last A1C was 8% on 04/16/22.

## 2022-04-22 ENCOUNTER — Encounter: Payer: Self-pay | Admitting: Surgery

## 2022-04-22 ENCOUNTER — Encounter
Admission: RE | Disposition: A | Discharge status: Home / Self Care | Payer: Self-pay | Source: Home / Self Care | Attending: Surgery

## 2022-04-22 ENCOUNTER — Ambulatory Visit: Payer: Self-pay | Admitting: Certified Registered"

## 2022-04-22 ENCOUNTER — Other Ambulatory Visit: Payer: Self-pay

## 2022-04-22 ENCOUNTER — Ambulatory Visit: Payer: Self-pay | Admitting: Urgent Care

## 2022-04-22 ENCOUNTER — Ambulatory Visit
Admission: RE | Admit: 2022-04-22 | Discharge: 2022-04-22 | Disposition: A | Discharge status: Home / Self Care | Payer: Self-pay | Attending: Surgery | Admitting: Surgery

## 2022-04-22 DIAGNOSIS — K8020 Calculus of gallbladder without cholecystitis without obstruction: Secondary | ICD-10-CM

## 2022-04-22 DIAGNOSIS — E119 Type 2 diabetes mellitus without complications: Secondary | ICD-10-CM

## 2022-04-22 DIAGNOSIS — E785 Hyperlipidemia, unspecified: Secondary | ICD-10-CM | POA: Insufficient documentation

## 2022-04-22 DIAGNOSIS — K219 Gastro-esophageal reflux disease without esophagitis: Secondary | ICD-10-CM | POA: Insufficient documentation

## 2022-04-22 DIAGNOSIS — I10 Essential (primary) hypertension: Secondary | ICD-10-CM | POA: Insufficient documentation

## 2022-04-22 DIAGNOSIS — F32A Depression, unspecified: Secondary | ICD-10-CM | POA: Insufficient documentation

## 2022-04-22 DIAGNOSIS — K8010 Calculus of gallbladder with chronic cholecystitis without obstruction: Secondary | ICD-10-CM | POA: Insufficient documentation

## 2022-04-22 DIAGNOSIS — Z8616 Personal history of COVID-19: Secondary | ICD-10-CM | POA: Insufficient documentation

## 2022-04-22 DIAGNOSIS — Z7984 Long term (current) use of oral hypoglycemic drugs: Secondary | ICD-10-CM | POA: Insufficient documentation

## 2022-04-22 LAB — GLUCOSE, CAPILLARY
Glucose-Capillary: 284 mg/dL — ABNORMAL HIGH (ref 70–99)
Glucose-Capillary: 217 mg/dL — ABNORMAL HIGH (ref 70–99)

## 2022-04-22 SURGERY — CHOLECYSTECTOMY, ROBOT-ASSISTED, LAPAROSCOPIC
Anesthesia: General | Site: Abdomen

## 2022-04-22 MED ORDER — LIDOCAINE HCL (CARDIAC) PF 100 MG/5ML IV SOSY
PREFILLED_SYRINGE | INTRAVENOUS | Status: DC | PRN
  Administered 2022-04-22: 100 mg via INTRAVENOUS

## 2022-04-22 MED ORDER — SUGAMMADEX SODIUM 200 MG/2ML IV SOLN
INTRAVENOUS | Status: DC | PRN
  Administered 2022-04-22: 200 mg via INTRAVENOUS

## 2022-04-22 MED ORDER — ACETAMINOPHEN 500 MG PO TABS
1000.0000 mg | ORAL_TABLET | ORAL | Status: AC
  Administered 2022-04-22: 1000 mg via ORAL

## 2022-04-22 MED ORDER — INDOCYANINE GREEN 25 MG IV SOLR
2.5000 mg | INTRAVENOUS | Status: AC
  Administered 2022-04-22: 2.5 mg via INTRAVENOUS
  Filled 2022-04-22: qty 1

## 2022-04-22 MED ORDER — BUPIVACAINE-EPINEPHRINE (PF) 0.25% -1:200000 IJ SOLN
INTRAMUSCULAR | Status: AC
  Filled 2022-04-22: qty 30

## 2022-04-22 MED ORDER — SODIUM CHLORIDE 0.9 % IV SOLN: INTRAVENOUS | Status: DC

## 2022-04-22 MED ORDER — BUPIVACAINE LIPOSOME 1.3 % IJ SUSP
INTRAMUSCULAR | Status: AC
  Filled 2022-04-22: qty 10

## 2022-04-22 MED ORDER — FENTANYL CITRATE (PF) 100 MCG/2ML IJ SOLN
25.0000 ug | INTRAMUSCULAR | Status: DC | PRN
  Administered 2022-04-22 (×3): 25 ug via INTRAVENOUS

## 2022-04-22 MED ORDER — PROPOFOL 10 MG/ML IV BOLUS
INTRAVENOUS | Status: DC | PRN
  Administered 2022-04-22: 200 mg via INTRAVENOUS

## 2022-04-22 MED ORDER — 0.9 % SODIUM CHLORIDE (POUR BTL) OPTIME
TOPICAL | Status: DC | PRN
  Administered 2022-04-22: 1000 mL

## 2022-04-22 MED ORDER — CHLORHEXIDINE GLUCONATE CLOTH 2 % EX PADS
6.0000 | MEDICATED_PAD | Freq: Once | CUTANEOUS | Status: AC
  Administered 2022-04-22: 6 via TOPICAL

## 2022-04-22 MED ORDER — PROPOFOL 10 MG/ML IV BOLUS
INTRAVENOUS | Status: AC
  Filled 2022-04-22: qty 20

## 2022-04-22 MED ORDER — ONDANSETRON HCL 4 MG PO TABS: 4.0000 mg | ORAL_TABLET | Freq: Every day | ORAL | 1 refills | Status: AC | PRN

## 2022-04-22 MED ORDER — ORAL CARE MOUTH RINSE: 15.0000 mL | Freq: Once | OROMUCOSAL | Status: AC

## 2022-04-22 MED ORDER — CHLORHEXIDINE GLUCONATE 0.12 % MT SOLN
15.0000 mL | Freq: Once | OROMUCOSAL | Status: AC
  Administered 2022-04-22: 15 mL via OROMUCOSAL

## 2022-04-22 MED ORDER — BUPIVACAINE LIPOSOME 1.3 % IJ SUSP: 20.0000 mL | Freq: Once | INTRAMUSCULAR | Status: DC

## 2022-04-22 MED ORDER — CEFAZOLIN SODIUM-DEXTROSE 2-4 GM/100ML-% IV SOLN
INTRAVENOUS | Status: AC
  Filled 2022-04-22: qty 100

## 2022-04-22 MED ORDER — CHLORHEXIDINE GLUCONATE 0.12 % MT SOLN
OROMUCOSAL | Status: AC
  Filled 2022-04-22: qty 15

## 2022-04-22 MED ORDER — GABAPENTIN 300 MG PO CAPS
300.0000 mg | ORAL_CAPSULE | ORAL | Status: AC
  Administered 2022-04-22: 300 mg via ORAL

## 2022-04-22 MED ORDER — BUPIVACAINE-EPINEPHRINE (PF) 0.25% -1:200000 IJ SOLN
INTRAMUSCULAR | Status: DC | PRN
  Administered 2022-04-22: 40 mL

## 2022-04-22 MED ORDER — FENTANYL CITRATE (PF) 100 MCG/2ML IJ SOLN
INTRAMUSCULAR | Status: AC
  Administered 2022-04-22: 25 ug via INTRAVENOUS
  Filled 2022-04-22: qty 2

## 2022-04-22 MED ORDER — LIDOCAINE HCL (PF) 2 % IJ SOLN
INTRAMUSCULAR | Status: AC
  Filled 2022-04-22: qty 5

## 2022-04-22 MED ORDER — ACETAMINOPHEN 500 MG PO TABS: 1000.0000 mg | ORAL_TABLET | Freq: Four times a day (QID) | ORAL | Status: AC | PRN

## 2022-04-22 MED ORDER — EPHEDRINE SULFATE (PRESSORS) 50 MG/ML IJ SOLN
INTRAMUSCULAR | Status: DC | PRN
  Administered 2022-04-22: 5 mg via INTRAVENOUS

## 2022-04-22 MED ORDER — CEFAZOLIN SODIUM-DEXTROSE 2-4 GM/100ML-% IV SOLN
2.0000 g | INTRAVENOUS | Status: AC
  Administered 2022-04-22: 2 g via INTRAVENOUS

## 2022-04-22 MED ORDER — ACETAMINOPHEN 500 MG PO TABS
ORAL_TABLET | ORAL | Status: AC
  Filled 2022-04-22: qty 2

## 2022-04-22 MED ORDER — OXYCODONE HCL 5 MG PO TABS
ORAL_TABLET | ORAL | Status: AC
  Filled 2022-04-22: qty 1

## 2022-04-22 MED ORDER — OXYCODONE HCL 5 MG PO TABS: 5.0000 mg | ORAL_TABLET | ORAL | 0 refills | Status: DC | PRN

## 2022-04-22 MED ORDER — CHLORHEXIDINE GLUCONATE CLOTH 2 % EX PADS: 6.0000 | MEDICATED_PAD | Freq: Once | CUTANEOUS | Status: DC

## 2022-04-22 MED ORDER — FENTANYL CITRATE (PF) 100 MCG/2ML IJ SOLN
INTRAMUSCULAR | Status: DC | PRN
  Administered 2022-04-22 (×2): 50 ug via INTRAVENOUS

## 2022-04-22 MED ORDER — OXYCODONE HCL 5 MG PO TABS
5.0000 mg | ORAL_TABLET | Freq: Once | ORAL | Status: AC
  Administered 2022-04-22: 5 mg via ORAL

## 2022-04-22 MED ORDER — FENTANYL CITRATE (PF) 100 MCG/2ML IJ SOLN
INTRAMUSCULAR | Status: AC
  Filled 2022-04-22: qty 2

## 2022-04-22 MED ORDER — GABAPENTIN 300 MG PO CAPS
ORAL_CAPSULE | ORAL | Status: AC
  Filled 2022-04-22: qty 1

## 2022-04-22 MED ORDER — ROCURONIUM BROMIDE 100 MG/10ML IV SOLN
INTRAVENOUS | Status: DC | PRN
  Administered 2022-04-22: 20 mg via INTRAVENOUS
  Administered 2022-04-22: 50 mg via INTRAVENOUS

## 2022-04-22 MED ORDER — ONDANSETRON HCL 4 MG/2ML IJ SOLN: 4.0000 mg | Freq: Once | INTRAMUSCULAR | Status: DC | PRN

## 2022-04-22 SURGICAL SUPPLY — 50 items
CANNULA REDUC XI 12-8 STAPL (CANNULA) ×2
CANNULA REDUCER 12-8 DVNC XI (CANNULA) ×2 IMPLANT
CLIP LIGATING HEMO O LOK GREEN (MISCELLANEOUS) ×2 IMPLANT
CUP MEDICINE 2OZ PLAST GRAD ST (MISCELLANEOUS) ×2 IMPLANT
DERMABOND ADVANCED .7 DNX12 (GAUZE/BANDAGES/DRESSINGS) ×2 IMPLANT
DRAPE ARM DVNC X/XI (DISPOSABLE) ×8 IMPLANT
DRAPE COLUMN DVNC XI (DISPOSABLE) ×2 IMPLANT
DRAPE DA VINCI XI ARM (DISPOSABLE) ×8
DRAPE DA VINCI XI COLUMN (DISPOSABLE) ×2
ELECT CAUTERY BLADE TIP 2.5 (TIP) ×2
ELECT REM PT RETURN 9FT ADLT (ELECTROSURGICAL) ×2
ELECTRODE CAUTERY BLDE TIP 2.5 (TIP) ×2 IMPLANT
ELECTRODE REM PT RTRN 9FT ADLT (ELECTROSURGICAL) ×2 IMPLANT
GLOVE SURG SYN 7.0 (GLOVE) ×4 IMPLANT
GLOVE SURG SYN 7.0 PF PI (GLOVE) ×4 IMPLANT
GLOVE SURG SYN 7.5  E (GLOVE) ×4
GLOVE SURG SYN 7.5 E (GLOVE) ×4 IMPLANT
GLOVE SURG SYN 7.5 PF PI (GLOVE) ×4 IMPLANT
GOWN STRL REUS W/ TWL LRG LVL3 (GOWN DISPOSABLE) ×8 IMPLANT
GOWN STRL REUS W/TWL LRG LVL3 (GOWN DISPOSABLE) ×8
IRRIGATOR SUCT 8 DISP DVNC XI (IRRIGATION / IRRIGATOR) IMPLANT
IRRIGATOR SUCTION 8MM XI DISP (IRRIGATION / IRRIGATOR)
IV NS 1000ML (IV SOLUTION)
IV NS 1000ML BAXH (IV SOLUTION) IMPLANT
KIT PINK PAD W/HEAD ARE REST (MISCELLANEOUS) ×2
KIT PINK PAD W/HEAD ARM REST (MISCELLANEOUS) ×2 IMPLANT
LABEL OR SOLS (LABEL) ×2 IMPLANT
MANIFOLD NEPTUNE II (INSTRUMENTS) ×2 IMPLANT
NEEDLE HYPO 22GX1.5 SAFETY (NEEDLE) ×2 IMPLANT
NS IRRIG 500ML POUR BTL (IV SOLUTION) ×2 IMPLANT
OBTURATOR OPTICAL STANDARD 8MM (TROCAR) ×2
OBTURATOR OPTICAL STND 8 DVNC (TROCAR) ×2
OBTURATOR OPTICALSTD 8 DVNC (TROCAR) ×2 IMPLANT
PACK LAP CHOLECYSTECTOMY (MISCELLANEOUS) ×2 IMPLANT
PENCIL SMOKE EVACUATOR (MISCELLANEOUS) ×2 IMPLANT
SEAL CANN UNIV 5-8 DVNC XI (MISCELLANEOUS) ×6 IMPLANT
SEAL XI 5MM-8MM UNIVERSAL (MISCELLANEOUS) ×6
SET TUBE SMOKE EVAC HIGH FLOW (TUBING) ×2 IMPLANT
SOLUTION ELECTROLUBE (MISCELLANEOUS) ×2 IMPLANT
SPIKE FLUID TRANSFER (MISCELLANEOUS) ×2 IMPLANT
STAPLER CANNULA SEAL DVNC XI (STAPLE) ×2 IMPLANT
STAPLER CANNULA SEAL XI (STAPLE) ×2
SUT MNCRL AB 4-0 PS2 18 (SUTURE) ×2 IMPLANT
SUT VIC AB 3-0 SH 27 (SUTURE) ×2
SUT VIC AB 3-0 SH 27X BRD (SUTURE) IMPLANT
SUT VICRYL 0 UR6 27IN ABS (SUTURE) ×4 IMPLANT
SYS BAG RETRIEVAL 10MM (BASKET) ×2
SYSTEM BAG RETRIEVAL 10MM (BASKET) ×2 IMPLANT
TRAP FLUID SMOKE EVACUATOR (MISCELLANEOUS) ×2 IMPLANT
WATER STERILE IRR 500ML POUR (IV SOLUTION) ×2 IMPLANT

## 2022-04-22 NOTE — Anesthesia Preprocedure Evaluation (Signed)
Anesthesia Evaluation  Patient identified by MRN, date of birth, ID band Patient awake    Reviewed: Allergy & Precautions, H&P , NPO status , Patient's Chart, lab work & pertinent test results, reviewed documented beta blocker date and time   History of Anesthesia Complications (+) history of anesthetic complications  Airway Mallampati: II  TM Distance: >3 FB Neck ROM: full    Dental  (+) Poor Dentition, Dental Advidsory Given, Missing   Pulmonary neg pulmonary ROS   Pulmonary exam normal breath sounds clear to auscultation       Cardiovascular Exercise Tolerance: Good hypertension, (-) angina (-) Past MI and (-) Cardiac Stents Normal cardiovascular exam(-) dysrhythmias (-) Valvular Problems/Murmurs Rhythm:regular Rate:Normal     Neuro/Psych  PSYCHIATRIC DISORDERS  Depression    negative neurological ROS     GI/Hepatic Neg liver ROS,GERD  ,,  Endo/Other  diabetes    Renal/GU negative Renal ROS  negative genitourinary   Musculoskeletal   Abdominal   Peds  Hematology negative hematology ROS (+)   Anesthesia Other Findings Past Medical History: No date: Complication of anesthesia     Comment:  Reports feeling of choking when she got an epidural. 12/26/2018: COVID-19 No date: Depression No date: Diabetes mellitus without complication (HCC) No date: GERD (gastroesophageal reflux disease) No date: Hyperlipemia No date: Hypertension   Reproductive/Obstetrics negative OB ROS                             Anesthesia Physical Anesthesia Plan  ASA: 2  Anesthesia Plan: General   Post-op Pain Management:    Induction: Intravenous  PONV Risk Score and Plan: 3 and Ondansetron, Dexamethasone and Treatment may vary due to age or medical condition  Airway Management Planned: Oral ETT  Additional Equipment:   Intra-op Plan:   Post-operative Plan: Extubation in OR  Informed Consent: I  have reviewed the patients History and Physical, chart, labs and discussed the procedure including the risks, benefits and alternatives for the proposed anesthesia with the patient or authorized representative who has indicated his/her understanding and acceptance.     Dental Advisory Given  Plan Discussed with: Anesthesiologist, CRNA and Surgeon  Anesthesia Plan Comments:        Anesthesia Quick Evaluation

## 2022-04-22 NOTE — Anesthesia Procedure Notes (Signed)
Procedure Name: Intubation Date/Time: 04/22/2022 2:47 PM  Performed by: Karoline Caldwell, CRNAPre-anesthesia Checklist: Patient identified, Patient being monitored, Timeout performed, Emergency Drugs available and Suction available Patient Re-evaluated:Patient Re-evaluated prior to induction Oxygen Delivery Method: Circle system utilized Preoxygenation: Pre-oxygenation with 100% oxygen Induction Type: IV induction Ventilation: Mask ventilation without difficulty Laryngoscope Size: 3 and McGraph Grade View: Grade I Tube type: Oral Tube size: 7.0 mm Number of attempts: 1 Airway Equipment and Method: Stylet Placement Confirmation: ETT inserted through vocal cords under direct vision, positive ETCO2 and breath sounds checked- equal and bilateral Secured at: 21 cm Tube secured with: Tape Dental Injury: Teeth and Oropharynx as per pre-operative assessment

## 2022-04-22 NOTE — Anesthesia Postprocedure Evaluation (Signed)
Anesthesia Post Note  Patient: Gwyneth Sprout Zuniga  Procedure(s) Performed: XI ROBOTIC ASSISTED LAPAROSCOPIC CHOLECYSTECTOMY (Abdomen) INDOCYANINE GREEN FLUORESCENCE IMAGING (ICG)  Patient location during evaluation: PACU Anesthesia Type: General Level of consciousness: awake and alert Pain management: pain level controlled Vital Signs Assessment: post-procedure vital signs reviewed and stable Respiratory status: spontaneous breathing, nonlabored ventilation, respiratory function stable and patient connected to nasal cannula oxygen Cardiovascular status: blood pressure returned to baseline and stable Postop Assessment: no apparent nausea or vomiting Anesthetic complications: no   No notable events documented.   Last Vitals:  Vitals:   04/22/22 1725 04/22/22 1737  BP: (!) 150/59 (!) 154/66  Pulse: 87 87  Resp: 17 18  Temp: 36.6 C 36.6 C  SpO2: 98% 98%    Last Pain:  Vitals:   04/22/22 1737  TempSrc:   PainSc: 0-No pain                 Cleda Mccreedy Alahni Varone

## 2022-04-22 NOTE — Op Note (Signed)
  Procedure Date:  04/22/2022  Pre-operative Diagnosis:  Symptomatic cholelithiasis  Post-operative Diagnosis: Symptomatic cholelithiasis  Procedure:  Robotic assisted cholecystectomy with ICG FireFly cholangiogram  Surgeon:  Howie Ill, MD  Anesthesia:  General endotracheal  Estimated Blood Loss:  10 ml  Specimens:  gallbladder  Complications:  None  Indications for Procedure:  This is a 71 y.o. female who presents with abdominal pain and workup revealing symptomatic cholelithiasis.  The benefits, complications, treatment options, and expected outcomes were discussed with the patient. The risks of bleeding, infection, recurrence of symptoms, failure to resolve symptoms, bile duct damage, bile duct leak, retained common bile duct stone, bowel injury, and need for further procedures were all discussed with the patient and she was willing to proceed.  Description of Procedure: The patient was correctly identified in the preoperative area and brought into the operating room.  The patient was placed supine with VTE prophylaxis in place.  Appropriate time-outs were performed.  Anesthesia was induced and the patient was intubated.  Appropriate antibiotics were infused.  The abdomen was prepped and draped in a sterile fashion. An infraumbilical incision was made. A cutdown technique was used to enter the abdominal cavity without injury, and a 12 mm robotic port was inserted.  Pneumoperitoneum was obtained with appropriate opening pressures.  Three 8-mm ports were placed in the mid abdomen at the level of the umbilicus under direct visualization.  The DaVinci platform was docked, camera targeted, and instruments were placed under direct visualization.  The gallbladder was identified.  The fundus was grasped and retracted cephalad.  Adhesions were lysed bluntly and with electrocautery. The infundibulum was grasped and retracted laterally, exposing the peritoneum overlying the gallbladder.   This was incised with electrocautery and extended on either side of the gallbladder.  FireFly cholangiogram was then obtained, and we were able to clearly identify the cystic duct and common bile duct.  The cystic duct and cystic artery were carefully dissected with combination of cautery and blunt dissection.  Both were clipped twice proximally and once distally, cutting in between.  The gallbladder was taken from the gallbladder fossa in a retrograde fashion with electrocautery. The gallbladder was placed in an Endocatch bag. The liver bed was inspected and any bleeding was controlled with electrocautery. The right upper quadrant was then inspected again revealing intact clips, no bleeding, and no ductal injury.  The 8 mm ports were removed under direct visualization and the 12 mm port was removed.  The Endocatch bag was brought out via the umbilical incision. The fascial opening was closed using 0 vicryl suture.  Local anesthetic was infused in all incisions and the incisions were closed with 4-0 Monocryl.  The wounds were cleaned and sealed with DermaBond.  The patient was emerged from anesthesia and extubated and brought to the recovery room for further management.  The patient tolerated the procedure well and all counts were correct at the end of the case.   Howie Ill, MD

## 2022-04-22 NOTE — Interval H&P Note (Signed)
History and Physical Interval Note:  04/22/2022 12:25 PM  Tara [place]  has presented today for surgery, with the diagnosis of symptomatic cholelithiasis.  The various methods of treatment have been discussed with the patient and family. After consideration of risks, benefits and other options for treatment, the patient has consented to  Procedure(s): XI ROBOTIC ASSISTED LAPAROSCOPIC CHOLECYSTECTOMY (N/A) INDOCYANINE GREEN FLUORESCENCE IMAGING (ICG) (N/A) as a surgical intervention.  The patient's history has been reviewed, patient examined, no change in status, stable for surgery.  I have reviewed the patient's chart and labs.  Questions were answered to the patient's satisfaction.     Tara [place]

## 2022-04-22 NOTE — Discharge Instructions (Addendum)
Instrucciones para la bupivacana de accin prolongada Ha recibido un anestsico de accin prolongada durante su estancia. Avise a su proveedor si tiene planeado otro procedimiento en las 96 horas siguientes a su procedimiento reciente. Los efectos txicos de los anestsicos locales son [place]. Evite la administracin adicional de anestsicos locales en las 96 horas siguientes a la administracin de anestsicos de accin prolongada ([place], [place], [employer], etc.).  INSTRUCCIONES PARA EL ALTA Llame al mdico para: dificultad para respirar, dolor de cabeza o alteraciones visuales Llame al mdico para: nuseas y vmitos persistentes Llame al mdico si: enrojecimiento, sensibilidad o signos de infeccin (dolor, hinchazn, enrojecimiento, olor o secrecin verde/amarilla alrededor del sitio de la incisin) Llame al mdico para: dolor intenso e incontrolado Llame al mdico para: temperatura >100,4 Dieta baja en sodio para la salud del corazn Instrucciones de descarga 1. El paciente puede [place], pero no frotar mucho las heridas y secarlas nicamente con toques. 2. No sumerja las heridas en una piscina o baera hasta que hayan sanado por completo. 3. No aplique pomadas ni perxido de hidrgeno en las heridas. 4. Puede aplicar compresas de hielo a las heridas para mayor comodidad. 5. Puede tomar ibuprofeno 600 mg cada 8 horas si no es alrgico a ste. Restricciones de conduccin No conduzca mientras est tomando narcticos para Human resources officer. Antes de Science writer, asegrese de poder girar hacia la derecha y hacia la izquierda para observar los puntos ciegos sin dolor o Herbalist. Aumentar la actividad lentamente      CIRUGIA AMBULATORIA       Instruccionnes de alta    1.  Las drogas que se Dispensing optician en su cuerpo [employer], asi            que por las proximas 24 horas usted no debe:   Conducir Field seismologist) un automovil   Hacer ninguna decision  legal   Tomar ninguna bebida alcoholica  2.  A) [place] puede comenzar una dieta regular.  Es mejor que hoy empiece con           liquidos y gradualmente anada [address]       B) Puede comer cualquier comida que desee pero es mejor empezar con liquidos,                      luego sopitas con galletas saladas y gradualmente llegar a las comidas solidas.  3.  Por favor avise a su medico inmediatamente si usted tiene algun sangrado anormal,       tiene dificultad con la respiracion, enrojecimiento y Engineer, mining en el sitio de la cirugia, [place],       fiebro o dolor que se alivia con [place].         B)  Por favor llame para hacer la cita posoperatoria.  5.  Istrucciones especificas :    AMBULATORY SURGERY  DISCHARGE INSTRUCTIONS   The drugs that you were given will stay in your system until tomorrow so for the next 24 hours you should not:  Drive an automobile Make any legal decisions Drink any alcoholic beverage   You may resume regular meals tomorrow.  Today it is better to start with liquids and gradually work up to solid foods.  You may eat anything you prefer, but it is better to start with liquids, then soup and crackers, and gradually work up to solid foods.   Please notify your doctor immediately if you have any unusual bleeding, trouble breathing, redness and pain at  the surgery site, drainage, fever, or pain not relieved by medication.     Your post-operative visit with Dr.                                       is: Date:                        Time:    Please call to schedule your post-operative visit.  Additional Instructions:

## 2022-04-22 NOTE — Transfer of Care (Signed)
Immediate Anesthesia Transfer of Care Note  Patient: Tara [place]  Procedure(s) Performed: XI ROBOTIC ASSISTED LAPAROSCOPIC CHOLECYSTECTOMY (Abdomen) INDOCYANINE GREEN FLUORESCENCE IMAGING (ICG)  Patient Location: PACU  Anesthesia Type:General  Level of Consciousness: drowsy  Airway & Oxygen Therapy: Patient Spontanous Breathing and Patient connected to face mask oxygen  Post-op Assessment: Report given to RN and Post -op Vital signs reviewed and stable  Post vital signs: Reviewed and stable  Last Vitals:  Vitals Value Taken Time  BP 147/58 04/22/22 1645  Temp 36.4 C 04/22/22 1642  Pulse 90 04/22/22 1646  Resp 21 04/22/22 1646  SpO2 96 % 04/22/22 1646  Vitals shown include unvalidated device data.  Last Pain:  Vitals:   04/22/22 1201  TempSrc: Temporal  PainSc: 0-No pain         Complications: No notable events documented.

## 2022-04-24 LAB — SURGICAL PATHOLOGY

## 2022-05-08 ENCOUNTER — Ambulatory Visit (INDEPENDENT_AMBULATORY_CARE_PROVIDER_SITE_OTHER): Payer: Self-pay | Admitting: Surgery

## 2022-05-08 ENCOUNTER — Encounter: Payer: Self-pay | Admitting: Surgery

## 2022-05-08 VITALS — BP 120/64 | HR 91 | Temp 98.6°F | Wt 198.6 lb

## 2022-05-08 DIAGNOSIS — Z09 Encounter for follow-up examination after completed treatment for conditions other than malignant neoplasm: Secondary | ICD-10-CM

## 2022-05-08 DIAGNOSIS — K8020 Calculus of gallbladder without cholecystitis without obstruction: Secondary | ICD-10-CM

## 2022-05-08 NOTE — Progress Notes (Signed)
05/08/2022  HPI: Suzana Sohail is a 71 y.o. female s/p robotic assisted cholecystectomy on 04/22/2022.  Patient presents today for follow-up.  She reports that initially she has been having some bloatedness and loose stools but overall has been improving.  Denies any nausea or vomiting.  Her abdominal pain from the incisions has been improving as well.  Vital signs: BP 120/64   Pulse 91   Temp 98.6 F (37 C) (Oral)   Wt 198 lb 9.6 oz (90.1 kg)   SpO2 94%   BMI 38.79 kg/m    Physical Exam: Constitutional: No acute distress Abdomen: Soft, nondistended, appropriate sort of outpatient.  Incisions are clean, dry, intact with Dermabond still in place.  No evidence of any hernia.  Assessment/Plan: This is a 71 y.o. female s/p robotic assisted cholecystectomy.  - Discussed with patient that her symptoms are not uncommon as her body is adjusting to not having a gallbladder.  This should continue to improve as her body adjusts. - Discussed with her still 2 more weeks of no heavy lifting or pushing of no more than 15 pounds. - Follow-up as needed or if her symptoms do not resolve.   Howie Ill, MD  [facility]

## 2022-05-08 NOTE — Patient Instructions (Addendum)
POSTOPERATORIO GENERAL INSTRUCCIONES PARA EL PACIENTE  INSTRUCCIONES PARA EL CUIDADO DE LA HERIDA: Mantenga un apsito limpio y seco sobre la herida si hay drenaje. El vendaje inicial podr retirarse despus de 24 horas. Una vez que la herida haya dejado de drenar puedes dejarla abierta al aire. Si la ropa roza la herida o le causa irritacin y la herida no drena, puede cubrirla con un vendaje seco durante el da. Trate de mantener la herida seca y evite aplicar ungentos a menos que se lo indiquen. Si la herida se vuelve de color rojo brillante y dolorosa o comienza a drenar material infectado que no est claro, comunquese con su mdico de inmediato. Si la herida es ligeramente rosada y tiene una cresta gruesa y firme debajo, esto es normal y se conoce como cresta de curacin. Esto se resolver en las prximas 4 a 6 semanas.  BAOS: Puede ducharse si su cirujano se lo ha informado. Sin embargo, no se sumerja en una tina, jacuzzi o piscina hasta que las incisiones estn completamente selladas o su cirujano le haya indicado que puede hacerlo.  DIETA: Puede comer cualquier alimento que pueda tolerar. Es una buena idea llevar una dieta rica en fibra y beber muchos lquidos para prevenir el estreimiento. Si sufre estreimiento, es posible que desee tomar un laxante suave o tabletas de ducolax diariamente hasta que sus hbitos intestinales sean regulares. El estreimiento puede ser muy incmodo, junto con el esfuerzo, despus de una ciruga reciente.  ACTIVIDAD: Se le recomienda toser y respirar profundamente o usar su espirmetro incentivador, si le dieron uno, cada 15 a 30 minutos cuando est despierto. Esto ayudar a prevenir complicaciones respiratorias y fiebres bajas despus de la operacin si recibi anestesia general. Es posible que desee abrazar una almohada al toser y estornudar para agregar apoyo adicional al rea quirrgica, si se someti a una ciruga abdominal o torcica, lo que disminuir el  dolor durante estos momentos. Se le recomienda caminar y realizar actividades ligeras durante las prximas dos semanas. No debe levantar ms de 20 libras durante un total de 6 semanas despus de la ciruga, ya que podra aumentar el riesgo de sufrir complicaciones. Veinte libras equivalen aproximadamente a una bolsa de plstico de comestibles. En ese momento, escuche a su cuerpo al levantar, si tiene dolor al levantar, detngase y vuelva a intentarlo en unos das. El dolor despus de hacer ejercicios o actividades de la vida diaria es normal a medida que regresa a su rutina normal.  MEDICAMENTOS: Trate de tomar medicamentos narcticos y antiinflamatorios, como tylenol, ibuprofeno, naprosyn, etc., con las comidas. Esto minimizar el malestar estomacal causado por el medicamento. Si desarrolla nuseas y vmitos debido al analgsico, o desarrolla un sarpullido, suspenda el medicamento y comunquese con su mdico. No debe conducir, tomar decisiones importantes ni operar maquinaria mientras toma analgsicos narcticos.  BLOQUEADOR SOLAR Utilice bloqueador solar en el rea de la incisin durante el prximo ao si esta rea estar expuesta al sol. Esto ayuda a disminuir las cicatrices y le permitir evitar un rea oscura permanente sobre la incisin.  PREGUNTAS: No dude en llamar a nuestra oficina si tiene alguna pregunta y estaremos encantados de ayudarle. [phone].   

## 2023-05-28 ENCOUNTER — Other Ambulatory Visit: Payer: Self-pay | Admitting: Family Medicine

## 2023-05-28 DIAGNOSIS — E2839 Other primary ovarian failure: Secondary | ICD-10-CM
